# Patient Record
Sex: Male | Born: 1940 | Race: White | Hispanic: No | Marital: Married | Smoking: Former smoker
Health system: Southern US, Community
[De-identification: ages and names within clinical notes are randomized; demographics above are authoritative.]

## PROBLEM LIST (undated history)

## (undated) DIAGNOSIS — K219 Gastro-esophageal reflux disease without esophagitis: Secondary | ICD-10-CM

## (undated) DIAGNOSIS — D126 Benign neoplasm of colon, unspecified: Secondary | ICD-10-CM

## (undated) DIAGNOSIS — I10 Essential (primary) hypertension: Secondary | ICD-10-CM

## (undated) DIAGNOSIS — C61 Malignant neoplasm of prostate: Secondary | ICD-10-CM

## (undated) DIAGNOSIS — T7840XA Allergy, unspecified, initial encounter: Secondary | ICD-10-CM

## (undated) DIAGNOSIS — K5790 Diverticulosis of intestine, part unspecified, without perforation or abscess without bleeding: Secondary | ICD-10-CM

## (undated) DIAGNOSIS — C4490 Unspecified malignant neoplasm of skin, unspecified: Secondary | ICD-10-CM

## (undated) DIAGNOSIS — I499 Cardiac arrhythmia, unspecified: Secondary | ICD-10-CM

## (undated) DIAGNOSIS — G4733 Obstructive sleep apnea (adult) (pediatric): Secondary | ICD-10-CM

## (undated) DIAGNOSIS — E785 Hyperlipidemia, unspecified: Secondary | ICD-10-CM

## (undated) DIAGNOSIS — I509 Heart failure, unspecified: Secondary | ICD-10-CM

## (undated) HISTORY — DX: Essential (primary) hypertension: I10

## (undated) HISTORY — DX: Diverticulosis of intestine, part unspecified, without perforation or abscess without bleeding: K57.90

## (undated) HISTORY — DX: Hyperlipidemia, unspecified: E78.5

## (undated) HISTORY — DX: Allergy, unspecified, initial encounter: T78.40XA

## (undated) HISTORY — DX: Malignant neoplasm of prostate: C61

## (undated) HISTORY — DX: Gastro-esophageal reflux disease without esophagitis: K21.9

## (undated) HISTORY — DX: Benign neoplasm of colon, unspecified: D12.6

## (undated) HISTORY — DX: Heart failure, unspecified: I50.9

## (undated) HISTORY — DX: Obstructive sleep apnea (adult) (pediatric): G47.33

## (undated) HISTORY — DX: Unspecified malignant neoplasm of skin, unspecified: C44.90

---

## 1997-08-10 ENCOUNTER — Ambulatory Visit (HOSPITAL_COMMUNITY): Admission: RE | Admit: 1997-08-10 | Discharge: 1997-08-10 | Payer: Self-pay | Admitting: Internal Medicine

## 1997-12-05 ENCOUNTER — Other Ambulatory Visit: Admission: RE | Admit: 1997-12-05 | Discharge: 1997-12-05 | Payer: Self-pay | Admitting: *Deleted

## 1997-12-06 HISTORY — PX: NASAL SEPTUM SURGERY: SHX37

## 1998-06-08 HISTORY — PX: TRANSURETHRAL NEEDLE ABLATION OF THE PROSTATE: SHX2574

## 1998-12-05 ENCOUNTER — Other Ambulatory Visit: Admission: RE | Admit: 1998-12-05 | Discharge: 1998-12-05 | Payer: Self-pay | Admitting: Urology

## 1999-01-01 ENCOUNTER — Encounter: Admission: RE | Admit: 1999-01-01 | Discharge: 1999-04-01 | Payer: Self-pay | Admitting: Family Medicine

## 1999-01-07 HISTORY — PX: PROSTATE SURGERY: SHX751

## 1999-01-30 ENCOUNTER — Ambulatory Visit (HOSPITAL_COMMUNITY): Admission: RE | Admit: 1999-01-30 | Discharge: 1999-01-30 | Payer: Self-pay | Admitting: Urology

## 1999-04-07 ENCOUNTER — Encounter: Admission: RE | Admit: 1999-04-07 | Discharge: 1999-07-06 | Payer: Self-pay | Admitting: Radiation Oncology

## 1999-06-09 HISTORY — PX: INSERTION PROSTATE RADIATION SEED: SUR718

## 1999-06-19 ENCOUNTER — Ambulatory Visit (HOSPITAL_BASED_OUTPATIENT_CLINIC_OR_DEPARTMENT_OTHER): Admission: RE | Admit: 1999-06-19 | Discharge: 1999-06-19 | Payer: Self-pay | Admitting: Urology

## 1999-06-19 ENCOUNTER — Encounter: Payer: Self-pay | Admitting: Urology

## 1999-07-10 ENCOUNTER — Ambulatory Visit (HOSPITAL_COMMUNITY): Admission: RE | Admit: 1999-07-10 | Discharge: 1999-07-10 | Payer: Self-pay | Admitting: Family Medicine

## 1999-07-17 ENCOUNTER — Encounter: Admission: RE | Admit: 1999-07-17 | Discharge: 1999-10-15 | Payer: Self-pay | Admitting: Radiation Oncology

## 2000-10-04 ENCOUNTER — Ambulatory Visit (HOSPITAL_COMMUNITY): Admission: RE | Admit: 2000-10-04 | Discharge: 2000-10-04 | Payer: Self-pay | Admitting: Internal Medicine

## 2000-10-04 ENCOUNTER — Encounter: Payer: Self-pay | Admitting: Internal Medicine

## 2000-12-15 ENCOUNTER — Other Ambulatory Visit: Admission: RE | Admit: 2000-12-15 | Discharge: 2000-12-15 | Payer: Self-pay | Admitting: Gastroenterology

## 2000-12-15 ENCOUNTER — Encounter (INDEPENDENT_AMBULATORY_CARE_PROVIDER_SITE_OTHER): Payer: Self-pay | Admitting: Specialist

## 2001-04-13 ENCOUNTER — Encounter: Admission: RE | Admit: 2001-04-13 | Discharge: 2001-04-13 | Payer: Self-pay | Admitting: Internal Medicine

## 2001-04-13 ENCOUNTER — Encounter: Payer: Self-pay | Admitting: Internal Medicine

## 2001-12-01 ENCOUNTER — Ambulatory Visit (HOSPITAL_COMMUNITY): Admission: RE | Admit: 2001-12-01 | Discharge: 2001-12-01 | Payer: Self-pay | Admitting: Internal Medicine

## 2001-12-01 ENCOUNTER — Encounter: Payer: Self-pay | Admitting: Internal Medicine

## 2003-03-19 ENCOUNTER — Ambulatory Visit (HOSPITAL_COMMUNITY): Admission: RE | Admit: 2003-03-19 | Discharge: 2003-03-19 | Payer: Self-pay | Admitting: Internal Medicine

## 2003-03-19 ENCOUNTER — Encounter: Payer: Self-pay | Admitting: Internal Medicine

## 2004-02-26 ENCOUNTER — Ambulatory Visit (HOSPITAL_COMMUNITY): Admission: RE | Admit: 2004-02-26 | Discharge: 2004-02-26 | Payer: Self-pay | Admitting: Internal Medicine

## 2005-03-09 ENCOUNTER — Encounter: Admission: RE | Admit: 2005-03-09 | Discharge: 2005-03-09 | Payer: Self-pay | Admitting: Internal Medicine

## 2005-08-24 ENCOUNTER — Ambulatory Visit (HOSPITAL_COMMUNITY): Admission: RE | Admit: 2005-08-24 | Discharge: 2005-08-24 | Payer: Self-pay | Admitting: Internal Medicine

## 2009-02-14 ENCOUNTER — Encounter (INDEPENDENT_AMBULATORY_CARE_PROVIDER_SITE_OTHER): Payer: Self-pay | Admitting: *Deleted

## 2009-05-16 ENCOUNTER — Encounter: Admission: RE | Admit: 2009-05-16 | Discharge: 2009-05-16 | Payer: Self-pay | Admitting: Family Medicine

## 2010-10-24 NOTE — Op Note (Signed)
[place]  Patient:    Vincent [name]                           MRN: [id number] Proc. Date: 06/19/99 Adm. Date:  [id number] Attending:  Laqueta Jean CC:         Marinus Maw, M.D.                           Operative Report  PREOPERATIVE DIAGNOSIS:  Adenocarcinoma of the prostate.  POSTOPERATIVE DIAGNOSIS:  Adenocarcinoma of the prostate.  OPERATION:  Radioactive iodine seed prostate implant.  SURGEON:  Sigmund I. Patsi Sears, M.D.  ANESTHESIA:  General endotracheal.  PREPARATION:  After appropriate preanesthesia the patient was brought to the operating room and placed on the operating table in the dorsal supine position,  where [name] general endotracheal anesthesia was introduced.  He was replaced in the  dorsal lithotomy position.  The pubis was prepped with Betadine solution and draped in the usual fashion.  The radiation therapist is Dr. Maryln Gottron.  INDICATIONS:  This 70 year old married white male has [name] history of Gleason 3+4 adenocarcinoma in the right central biopsy, Gleason 5 adenocarcinoma in the left central biopsies, and Gleason 6 adenocarcinoma in the left lateral biopsies. Because of the patients elevated International Prostate symptoms, score sheet of 12, and failure of Flomax therapy, he underwent [name] transurethral needle ablation (2) precancer therapy.  Following this, the patient voided well, and underwent external beam radiation therapy.  He then went to [place] on [name] trip, and with full continence. He returned for seed implantation which is accomplished this morning.  DESCRIPTION OF PROCEDURE:  The patient undergoes an iodine seed implantation under routine circumstances.  [name] cystoscopy revealed one strand of seeds in the bladder at the right bladder neck, and these were removed cystoscopically.  The rectal wall was protected.  The urethra was protected.  [name] Foley catheter was left in place.  The patient  was awakened and taken to the recovery room in excellent condition. DD:  06/19/99 TD:  06/19/99 Job: [id number]

## 2011-02-13 ENCOUNTER — Encounter: Payer: Self-pay | Admitting: Gastroenterology

## 2011-03-09 ENCOUNTER — Encounter: Payer: Self-pay | Admitting: Gastroenterology

## 2011-03-09 ENCOUNTER — Other Ambulatory Visit: Payer: Self-pay | Admitting: Dermatology

## 2011-04-09 ENCOUNTER — Ambulatory Visit (AMBULATORY_SURGERY_CENTER): Payer: Medicare Other | Admitting: *Deleted

## 2011-04-09 VITALS — Ht 76.0 in | Wt 190.0 lb

## 2011-04-09 DIAGNOSIS — Z1211 Encounter for screening for malignant neoplasm of colon: Secondary | ICD-10-CM

## 2011-04-09 MED ORDER — PEG-KCL-NACL-NASULF-NA ASC-C 100 G PO SOLR: ORAL | Status: DC

## 2011-04-21 ENCOUNTER — Encounter: Payer: Self-pay | Admitting: Gastroenterology

## 2011-04-21 ENCOUNTER — Ambulatory Visit (AMBULATORY_SURGERY_CENTER): Payer: Medicare Other | Admitting: Gastroenterology

## 2011-04-21 DIAGNOSIS — Z86010 Personal history of colonic polyps: Secondary | ICD-10-CM

## 2011-04-21 DIAGNOSIS — K5730 Diverticulosis of large intestine without perforation or abscess without bleeding: Secondary | ICD-10-CM

## 2011-04-21 DIAGNOSIS — D126 Benign neoplasm of colon, unspecified: Secondary | ICD-10-CM

## 2011-04-21 DIAGNOSIS — Z1211 Encounter for screening for malignant neoplasm of colon: Secondary | ICD-10-CM

## 2011-04-21 MED ORDER — SODIUM CHLORIDE 0.9 % IV SOLN: 500.0000 mL | INTRAVENOUS | Status: DC

## 2011-04-21 NOTE — Patient Instructions (Addendum)
Diverticulosis Diverticulosis is a common condition that develops when small pouches (diverticula) form in the wall of the colon. The risk of diverticulosis increases with age. It happens more often in people who eat a low-fiber diet. Most individuals with diverticulosis have no symptoms. Those individuals with symptoms usually experience abdominal pain, constipation, or loose stools (diarrhea). HOME CARE INSTRUCTIONS   Increase the amount of fiber in your diet as directed by your caregiver or dietician. This may reduce symptoms of diverticulosis.   Your caregiver may recommend taking a dietary fiber supplement.   Drink at least 6 to 8 glasses of water each day to prevent constipation.   Try not to strain when you have a bowel movement.   Your caregiver may recommend avoiding nuts and seeds to prevent complications, although this is still an uncertain benefit.   Only take over-the-counter or prescription medicines for pain, discomfort, or fever as directed by your caregiver.  FOODS WITH HIGH FIBER CONTENT INCLUDE:  Fruits. Apple, peach, pear, tangerine, raisins, prunes.   Vegetables. Brussels sprouts, asparagus, broccoli, cabbage, carrot, cauliflower, romaine lettuce, spinach, summer squash, tomato, winter squash, zucchini.   Starchy Vegetables. Baked beans, kidney beans, lima beans, split peas, lentils, potatoes (with skin).   Grains. Whole wheat bread, brown rice, bran flake cereal, plain oatmeal, white rice, shredded wheat, bran muffins.  SEEK IMMEDIATE MEDICAL CARE IF:   You develop increasing pain or severe bloating.   You have an oral temperature above 102 F (38.9 C), not controlled by medicine.   You develop vomiting or bowel movements that are bloody or black.  Document Released: 02/20/2004 Document Revised: 02/04/2011 Document Reviewed: 10/23/2009 [facility] Patient Information 2012 [place], [place].  Please follow all discharge instructions given to you by the recovery room  nurse. If you have any questions or problems after discharge please call [phone]. You will receive a phone call in the am to see how you are doing and answer any questions you may have. Thank you for choosing [facility] for your health care needs.

## 2011-04-21 NOTE — Progress Notes (Signed)
Pt tolerated the colonoscopy very well. maw 

## 2011-04-21 NOTE — Progress Notes (Signed)
Patient did not experience any of the following events: a burn prior to discharge; a fall within the facility; wrong site/side/patient/procedure/implant event; or a hospital transfer or hospital admission upon discharge from the facility. SSI prophylaxis.   Patient did not have preoperative order for IV antibiotic SSI prophylaxis. [phone])

## 2011-04-22 ENCOUNTER — Telehealth: Payer: Self-pay | Admitting: *Deleted

## 2011-04-22 NOTE — Telephone Encounter (Signed)
Follow up Call- Patient questions:  Do you have a fever, pain , or abdominal swelling? no Pain Score  0 *  Have you tolerated food without any problems? yes  Have you been able to return to your normal activities? yes  Do you have any questions about your discharge instructions: Diet   no Medications  no Follow up visit  no  Do you have questions or concerns about your Care? yes  Actions: * If pain score is 4 or above: No action needed, pain <4.   

## 2011-05-05 ENCOUNTER — Telehealth: Payer: Self-pay | Admitting: Gastroenterology

## 2011-05-05 NOTE — Telephone Encounter (Signed)
Sorry, I mixed cellular. Tone that he has a benign polyp and a letter with recommendations is  forthcoming

## 2011-05-05 NOTE — Telephone Encounter (Signed)
Pt calling requesting results from path report from colon done 04/21/11.........Marland Kitchenplease advise.

## 2011-05-05 NOTE — Telephone Encounter (Signed)
Discussed with pt his path report and the result letter. Pt aware.

## 2011-05-22 ENCOUNTER — Other Ambulatory Visit: Payer: Self-pay | Admitting: Family Medicine

## 2011-05-22 ENCOUNTER — Ambulatory Visit
Admission: RE | Admit: 2011-05-22 | Discharge: 2011-05-22 | Disposition: A | Discharge status: Home / Self Care | Payer: Medicare Other | Source: Ambulatory Visit | Attending: Family Medicine | Admitting: Family Medicine

## 2011-05-22 DIAGNOSIS — R7611 Nonspecific reaction to tuberculin skin test without active tuberculosis: Secondary | ICD-10-CM

## 2011-10-20 ENCOUNTER — Other Ambulatory Visit: Payer: Self-pay | Admitting: Dermatology

## 2012-05-11 IMAGING — CR DG CHEST 2V
2 series · 2 of 2 positions shown · non-contrast
Comparison: Chest radiographs 05/16/2009.

CLINICAL DATA: Positive PPD.  History of smoking and hypertension.

CHEST - 2 VIEW

[w chest pa]
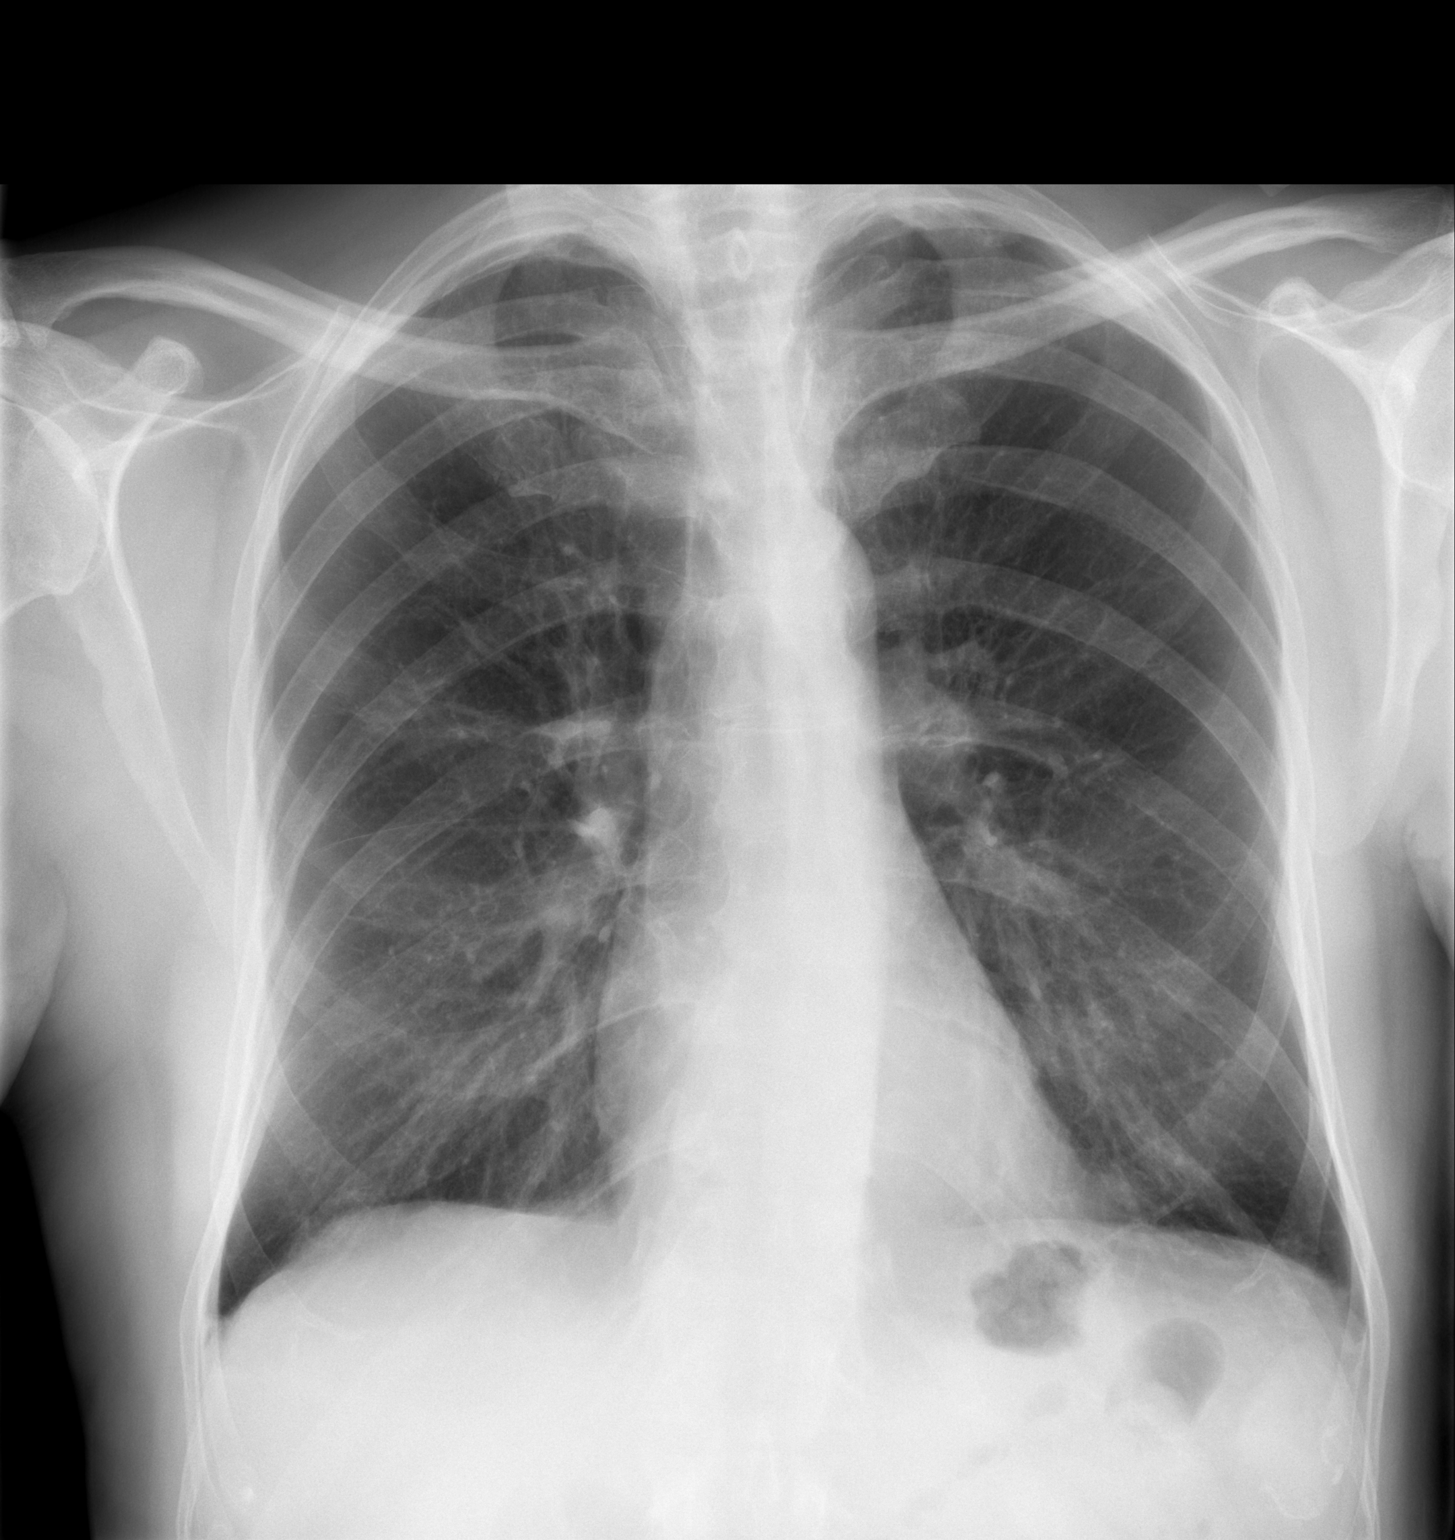

[w chest lat]
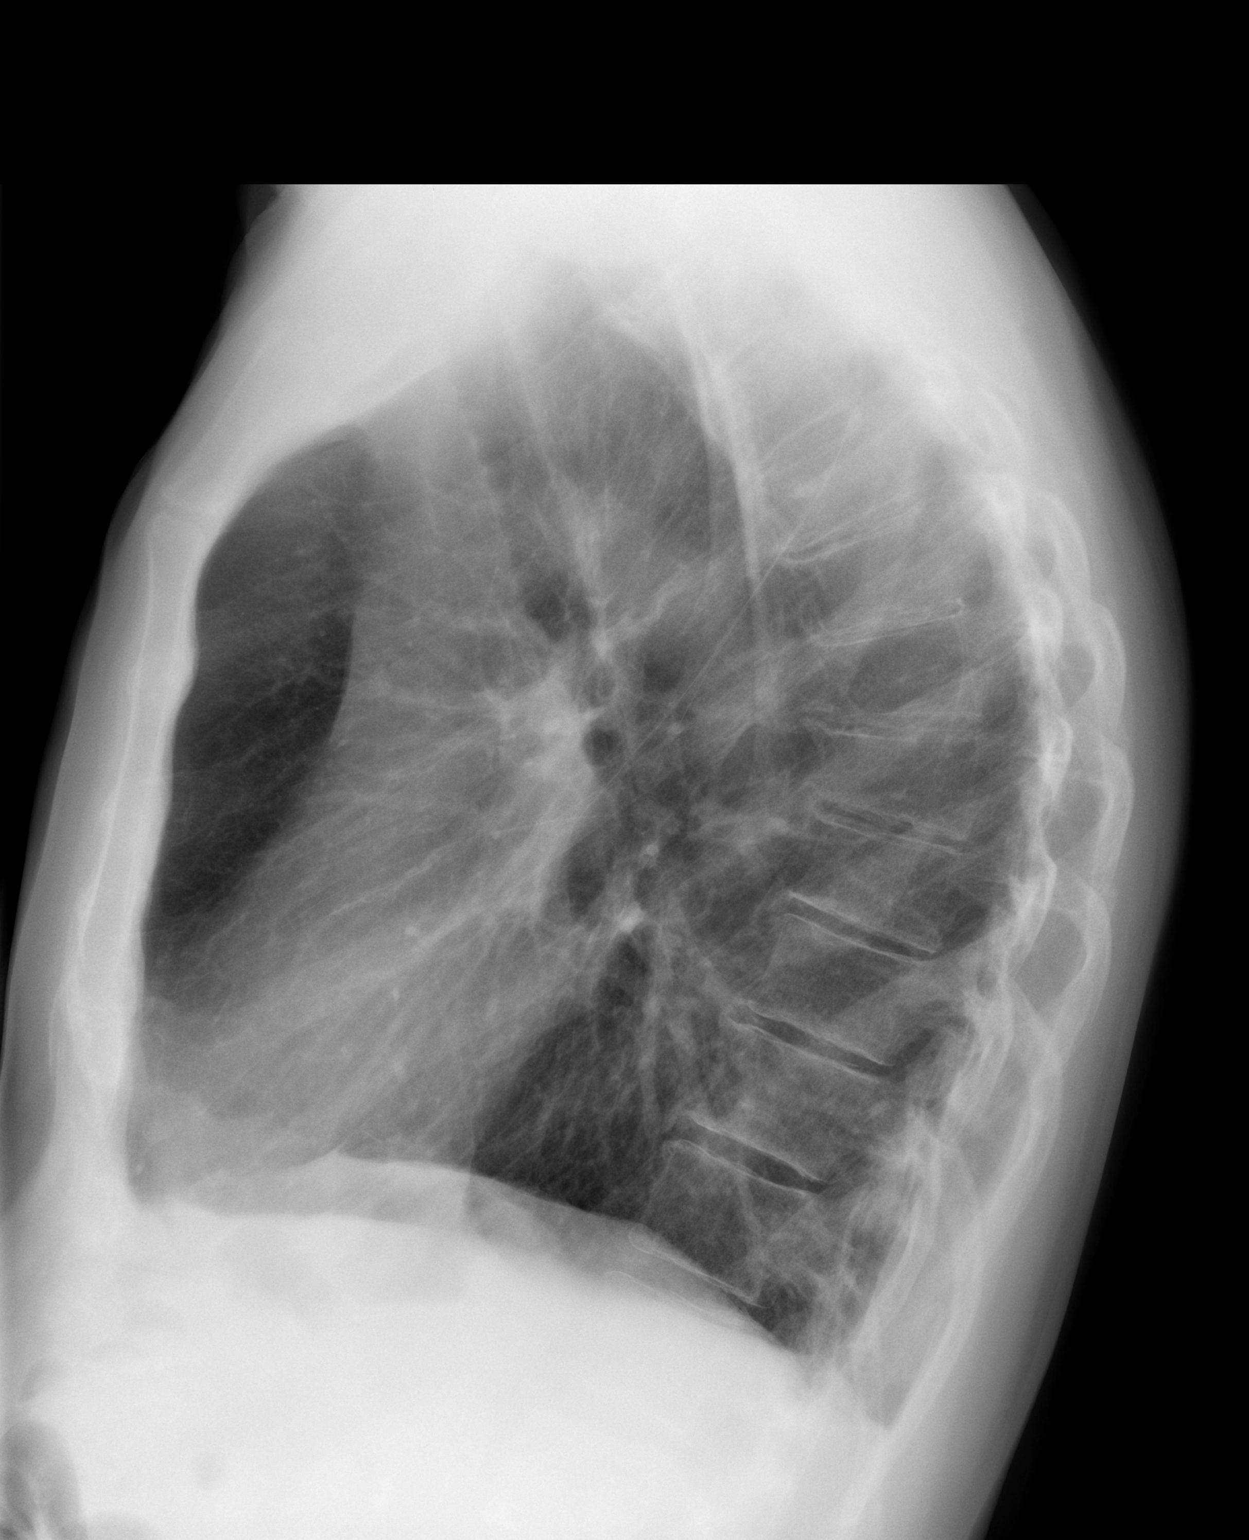

[2 of 2 positions shown; findings below may reference images not displayed]

FINDINGS: The heart size and mediastinal contours are stable.
There is stable mild scarring in the right perihilar region.  Mild
biapical pleural thickening is stable.  There is no confluent
airspace opacity or pleural effusion.  Osseous structures appear
unchanged.
IMPRESSION: Stable examination.  No acute findings or radiographic evidence of
tuberculosis.

## 2012-08-04 ENCOUNTER — Emergency Department (HOSPITAL_COMMUNITY)
Admission: EM | Admit: 2012-08-04 | Discharge: 2012-08-05 | Disposition: A | Discharge status: Home / Self Care | Payer: Medicare Other | Attending: Emergency Medicine | Admitting: Emergency Medicine

## 2012-08-04 ENCOUNTER — Encounter (HOSPITAL_COMMUNITY): Payer: Self-pay | Admitting: *Deleted

## 2012-08-04 DIAGNOSIS — Z923 Personal history of irradiation: Secondary | ICD-10-CM | POA: Insufficient documentation

## 2012-08-04 DIAGNOSIS — K219 Gastro-esophageal reflux disease without esophagitis: Secondary | ICD-10-CM | POA: Insufficient documentation

## 2012-08-04 DIAGNOSIS — Z79899 Other long term (current) drug therapy: Secondary | ICD-10-CM | POA: Insufficient documentation

## 2012-08-04 DIAGNOSIS — Z8546 Personal history of malignant neoplasm of prostate: Secondary | ICD-10-CM | POA: Insufficient documentation

## 2012-08-04 DIAGNOSIS — Z85828 Personal history of other malignant neoplasm of skin: Secondary | ICD-10-CM | POA: Insufficient documentation

## 2012-08-04 DIAGNOSIS — Z7982 Long term (current) use of aspirin: Secondary | ICD-10-CM | POA: Insufficient documentation

## 2012-08-04 DIAGNOSIS — S61209A Unspecified open wound of unspecified finger without damage to nail, initial encounter: Secondary | ICD-10-CM | POA: Insufficient documentation

## 2012-08-04 DIAGNOSIS — Y929 Unspecified place or not applicable: Secondary | ICD-10-CM | POA: Insufficient documentation

## 2012-08-04 DIAGNOSIS — I10 Essential (primary) hypertension: Secondary | ICD-10-CM | POA: Insufficient documentation

## 2012-08-04 DIAGNOSIS — W268XXA Contact with other sharp object(s), not elsewhere classified, initial encounter: Secondary | ICD-10-CM | POA: Insufficient documentation

## 2012-08-04 DIAGNOSIS — Z87891 Personal history of nicotine dependence: Secondary | ICD-10-CM | POA: Insufficient documentation

## 2012-08-04 DIAGNOSIS — Y93G1 Activity, food preparation and clean up: Secondary | ICD-10-CM | POA: Insufficient documentation

## 2012-08-04 NOTE — ED Notes (Signed)
Pt unloading dishwasher; pt cut middle finger right hand; small laceration noted with bleeding thru initial bandage; pressure dressing applied; does not take blood thinners

## 2012-08-04 NOTE — ED Notes (Signed)
PA student at bedside suturing pt's finger.

## 2012-08-04 NOTE — ED Provider Notes (Signed)
History  This chart was scribed for non-physician practitioner working with No att. providers found, by Candelaria Stagers, ED Scribe. This patient was seen in room WTR5/WTR5 and the patient's care was started at 10:57 PM   CSN: [id number]  Arrival date & time 08/04/12  2202   First MD Initiated Contact with Patient 08/04/12 2234      Chief Complaint  Patient presents with  . Laceration    The history is provided by the patient. No language interpreter was used.   Vincent [name] is a 72 y.o. male who presents to the Emergency Department complaining of a laceration to his right middle finger after unloading the dishwasher and cutting his finger on a broken ceramic mug.  Pt reports his pain currently as a 2/10.  Pt took hydrocodone before the incident for a previous shoulder problem.  Pt does not take blood thinners.  Last tetanus shot 2012. Denies lightheadedness, dizziness, weakness, numbness or tingling.  Past Medical History  Diagnosis Date  . Allergy     seasonal  . Cancer     basal cell/squamouscell  . GERD (gastroesophageal reflux disease)   . Hyperlipidemia   . Hypertension   . Prostate cancer Nov.-Dec.2000    radiation    Past Surgical History  Procedure Laterality Date  . Prostate surgery  01/1999  . Insertion prostate radiation seed  06/1999  . Nasal septum surgery  12/1997    also polyps removed    No family history on file.  History  Substance Use Topics  . Smoking status: Former Games developer  . Smokeless tobacco: Never Used  . Alcohol Use: 6.0 oz/week    10 Glasses of wine per week      Review of Systems  Skin: Positive for wound (laceration to right middle finger).  All other systems reviewed and are negative.    Allergies  Review of patient's allergies indicates no known allergies.  Home Medications   Current Outpatient Rx  Name  Route  Sig  Dispense  Refill  . aspirin 81 MG tablet   Oral   Take 81 mg by mouth daily.           . bethanechol  (URECHOLINE) 25 MG tablet   Oral   Take 75 mg by mouth daily.         . bisoprolol-hydrochlorothiazide (ZIAC) 5-6.25 MG per tablet   Oral   Take 1 tablet by mouth daily.         . calcium carbonate (OS-CAL) 600 MG TABS   Oral   Take 1,200 mg by mouth daily.           . Cholecalciferol (VITAMIN D) 2000 UNITS tablet   Oral   Take 6,000 Units by mouth daily.           Marland Kitchen doxazosin (CARDURA) 4 MG tablet   Oral   Take 4 mg by mouth at bedtime.           Marland Kitchen HYDROcodone-acetaminophen (NORCO/VICODIN) 5-325 MG per tablet   Oral   Take 1-2 tablets by mouth every 6 (six) hours as needed for pain. Pain         . Misc Natural Products (PUMPKIN SEED OIL) CAPS   Oral   Take 1 capsule by mouth daily.           . mometasone (NASONEX) 50 MCG/ACT nasal spray   Nasal   Place 1 spray into the nose daily.           Marland Kitchen  Multiple Vitamins-Minerals (MULTIVITAMIN WITH MINERALS) tablet   Oral   Take 1 tablet by mouth daily.           Marland Kitchen omeprazole (PRILOSEC) 20 MG capsule   Oral   Take 1 tablet by mouth Daily.         . pravastatin (PRAVACHOL) 40 MG tablet   Oral   Take 20 mg by mouth Daily.         . psyllium (REGULOID) 0.52 G capsule   Oral   Take 0.52 g by mouth daily.           . vitamin C (ASCORBIC ACID) 500 MG tablet   Oral   Take 500 mg by mouth daily.           . vitamin E 400 UNIT capsule   Oral   Take 400 Units by mouth daily.             BP 167/74  Pulse 85  Temp(Src) 98.6 F (37 C) (Oral)  Resp 16  Ht 6\' 4"  (1.93 m)  Wt 192 lb (87.091 kg)  BMI 23.38 kg/m2  SpO2 96%  Physical Exam  Nursing note and vitals reviewed. Constitutional: He is oriented to person, place, and time. He appears well-developed and well-nourished. No distress.  HENT:  Head: Normocephalic and atraumatic.  Mouth/Throat: Oropharynx is clear and moist.  Eyes: Conjunctivae and EOM are normal.  Neck: Normal range of motion. Neck supple. No tracheal deviation present.   Cardiovascular: Normal rate, regular rhythm, normal heart sounds and intact distal pulses.   Pulmonary/Chest: Effort normal and breath sounds normal. No respiratory distress.  Musculoskeletal: Normal range of motion.  1.5 cm laceration on the palmar aspect or right middle PIP.  No evidence of tendon disruption.  Good cap refill.  Full ROM.  Actively bleeding.    Neurological: He is alert and oriented to person, place, and time.  Skin: Skin is warm and dry. No pallor.  Psychiatric: He has a normal mood and affect. His behavior is normal.    ED Course  Procedures   DIAGNOSTIC STUDIES: Oxygen Saturation is 96% on room air, normal by my interpretation.    COORDINATION OF CARE:  10:57 PM Discussed course of care with pt which includes laceration repair.  Pt understands and agrees.  11:06 PM LACERATION REPAIR Performed by: Johnnette Gourd, PA-C Consent: Verbal consent obtained. Risks and benefits: risks, benefits and alternatives were discussed Patient identity confirmed: provided demographic data Time out performed prior to procedure Prepped and Draped in normal sterile fashion Wound explored Laceration Location: palmar side of right middle finger Laceration Length: 1.5cm No Foreign Bodies seen or palpated Anesthesia: local infiltration Local anesthetic: lidocaine 2% without epinephrine Anesthetic total: 1.5 ml Irrigation method: syringe Amount of cleaning: standard Skin closure: 5-0 ethilon Number of sutures or staples: 5 Technique: simple interrupted Patient tolerance: Patient tolerated the procedure well with no immediate complications.  Labs Reviewed - No data to display No results found.   1. Finger laceration, initial encounter       MDM  72 y/o male with finger laceration. No evidence of tendinous disruption. Wound cleaned and explored without any foreign body. 5 sutures placed without difficulty. Patient is still neurovascularly intact without evidence of tendon  disruption after suture placement. Return precautions discussed. Wound care given. He will followup with his PCP for suture removal. Patient states understanding of plan and is agreeable.    I personally performed the services described in this documentation,  which was scribed in my presence. The recorded information has been reviewed and is accurate.      Trevor Mace, PA-C 08/05/12 0006

## 2012-08-05 NOTE — ED Provider Notes (Signed)
Medical screening examination/treatment/procedure(s) were performed by non-physician practitioner and as supervising physician I was immediately available for consultation/collaboration.   Lyanne Co, MD 08/05/12 [phone]

## 2012-08-24 ENCOUNTER — Other Ambulatory Visit: Payer: Self-pay | Admitting: Family Medicine

## 2012-08-24 DIAGNOSIS — M5412 Radiculopathy, cervical region: Secondary | ICD-10-CM

## 2012-08-24 DIAGNOSIS — M79601 Pain in right arm: Secondary | ICD-10-CM

## 2012-08-24 DIAGNOSIS — M542 Cervicalgia: Secondary | ICD-10-CM

## 2012-08-25 ENCOUNTER — Ambulatory Visit
Admission: RE | Admit: 2012-08-25 | Discharge: 2012-08-25 | Disposition: A | Discharge status: Home / Self Care | Payer: Self-pay | Source: Ambulatory Visit | Attending: Family Medicine | Admitting: Family Medicine

## 2012-08-25 DIAGNOSIS — M5412 Radiculopathy, cervical region: Secondary | ICD-10-CM

## 2012-08-25 DIAGNOSIS — M542 Cervicalgia: Secondary | ICD-10-CM

## 2012-08-25 DIAGNOSIS — M79601 Pain in right arm: Secondary | ICD-10-CM

## 2013-01-03 ENCOUNTER — Other Ambulatory Visit: Payer: Self-pay | Admitting: Dermatology

## 2013-09-26 ENCOUNTER — Other Ambulatory Visit: Payer: Self-pay | Admitting: Dermatology

## 2014-03-16 ENCOUNTER — Encounter (HOSPITAL_COMMUNITY): Payer: Self-pay

## 2014-03-16 ENCOUNTER — Encounter (HOSPITAL_COMMUNITY)
Admission: AD | Disposition: A | Discharge status: Home / Self Care | Payer: 59 | Source: Ambulatory Visit | Attending: Cardiology

## 2014-03-16 ENCOUNTER — Observation Stay (HOSPITAL_COMMUNITY)
Admission: AD | Admit: 2014-03-16 | Discharge: 2014-03-17 | Disposition: A | Discharge status: Home / Self Care | Payer: 59 | Source: Ambulatory Visit | Attending: Cardiology | Admitting: Cardiology

## 2014-03-16 DIAGNOSIS — Z923 Personal history of irradiation: Secondary | ICD-10-CM | POA: Insufficient documentation

## 2014-03-16 DIAGNOSIS — I10 Essential (primary) hypertension: Secondary | ICD-10-CM | POA: Diagnosis not present

## 2014-03-16 DIAGNOSIS — I209 Angina pectoris, unspecified: Secondary | ICD-10-CM | POA: Diagnosis present

## 2014-03-16 DIAGNOSIS — E785 Hyperlipidemia, unspecified: Secondary | ICD-10-CM | POA: Insufficient documentation

## 2014-03-16 DIAGNOSIS — K219 Gastro-esophageal reflux disease without esophagitis: Secondary | ICD-10-CM | POA: Diagnosis not present

## 2014-03-16 DIAGNOSIS — Z8546 Personal history of malignant neoplasm of prostate: Secondary | ICD-10-CM | POA: Diagnosis not present

## 2014-03-16 DIAGNOSIS — I25110 Atherosclerotic heart disease of native coronary artery with unstable angina pectoris: Principal | ICD-10-CM | POA: Insufficient documentation

## 2014-03-16 HISTORY — DX: Cardiac arrhythmia, unspecified: I49.9

## 2014-03-16 HISTORY — PX: LEFT HEART CATHETERIZATION WITH CORONARY ANGIOGRAM: SHX5451

## 2014-03-16 LAB — COMPREHENSIVE METABOLIC PANEL
ALBUMIN: 3.4 g/dL — AB (ref 3.5–5.2)
ALK PHOS: 66 U/L (ref 39–117)
ALT: 15 U/L (ref 0–53)
AST: 18 U/L (ref 0–37)
Anion gap: 10 (ref 5–15)
BUN: 18 mg/dL (ref 6–23)
CHLORIDE: 105 meq/L (ref 96–112)
CO2: 27 mEq/L (ref 19–32)
Calcium: 9.1 mg/dL (ref 8.4–10.5)
Creatinine, Ser: 0.95 mg/dL (ref 0.50–1.35)
GFR calc Af Amer: >90 mL/min (ref >90)
GFR calc non Af Amer: 79 mL/min — ABNORMAL LOW (ref >90)
Glucose, Bld: 111 mg/dL — ABNORMAL HIGH (ref 70–99)
POTASSIUM: 4 meq/L (ref 3.7–5.3)
Sodium: 142 mEq/L (ref 137–147)
Total Bilirubin: 0.4 mg/dL (ref 0.3–1.2)
Total Protein: 6.4 g/dL (ref 6.0–8.3)

## 2014-03-16 LAB — CBC WITH DIFFERENTIAL/PLATELET
BASOS ABS: 0 10*3/uL (ref 0.0–0.1)
Basophils Relative: 1 % (ref 0–1)
Eosinophils Absolute: 0.2 10*3/uL (ref 0.0–0.7)
Eosinophils Relative: 3 % (ref 0–5)
HEMATOCRIT: 41 % (ref 39.0–52.0)
HEMOGLOBIN: 13.5 g/dL (ref 13.0–17.0)
LYMPHS PCT: 15 % (ref 12–46)
Lymphs Abs: 1.1 10*3/uL (ref 0.7–4.0)
MCH: 28.1 pg (ref 26.0–34.0)
MCHC: 32.9 g/dL (ref 30.0–36.0)
MCV: 85.4 fL (ref 78.0–100.0)
MONO ABS: 0.6 10*3/uL (ref 0.1–1.0)
MONOS PCT: 8 % (ref 3–12)
NEUTROS ABS: 5.5 10*3/uL (ref 1.7–7.7)
Neutrophils Relative %: 73 % (ref 43–77)
Platelets: 122 10*3/uL — ABNORMAL LOW (ref 150–400)
RBC: 4.8 MIL/uL (ref 4.22–5.81)
RDW: 13.5 % (ref 11.5–15.5)
WBC: 7.5 10*3/uL (ref 4.0–10.5)

## 2014-03-16 LAB — PROTIME-INR
INR: 1.05 (ref 0.00–1.49)
Prothrombin Time: 13.7 seconds (ref 11.6–15.2)

## 2014-03-16 LAB — POCT ACTIVATED CLOTTING TIME
ACTIVATED CLOTTING TIME: 484 s
Activated Clotting Time: 225 seconds

## 2014-03-16 LAB — APTT: APTT: 31 s (ref 24–37)

## 2014-03-16 LAB — TSH: TSH: 1.14 u[IU]/mL (ref 0.350–4.500)

## 2014-03-16 SURGERY — LEFT HEART CATHETERIZATION WITH CORONARY ANGIOGRAM
Anesthesia: LOCAL

## 2014-03-16 MED ORDER — TICAGRELOR 90 MG PO TABS: 90.0000 mg | ORAL_TABLET | Freq: Two times a day (BID) | ORAL | Status: DC

## 2014-03-16 MED ORDER — SODIUM CHLORIDE 0.9 % IV SOLN: INTRAVENOUS | Status: DC

## 2014-03-16 MED ORDER — ACETAMINOPHEN 325 MG PO TABS
650.0000 mg | ORAL_TABLET | ORAL | Status: DC | PRN
  Administered 2014-03-16 – 2014-03-17 (×2): 650 mg via ORAL
  Filled 2014-03-16 (×2): qty 2

## 2014-03-16 MED ORDER — BETHANECHOL CHLORIDE 25 MG PO TABS
25.0000 mg | ORAL_TABLET | Freq: Two times a day (BID) | ORAL | Status: DC
  Administered 2014-03-16 – 2014-03-17 (×2): 25 mg via ORAL
  Filled 2014-03-16 (×5): qty 1

## 2014-03-16 MED ORDER — INFLUENZA VAC SPLIT QUAD 0.5 ML IM SUSY
0.5000 mL | PREFILLED_SYRINGE | INTRAMUSCULAR | Status: AC
  Administered 2014-03-17: 0.5 mL via INTRAMUSCULAR
  Filled 2014-03-16: qty 0.5

## 2014-03-16 MED ORDER — BIVALIRUDIN 250 MG IV SOLR
INTRAVENOUS | Status: AC
  Filled 2014-03-16: qty 250

## 2014-03-16 MED ORDER — TICAGRELOR 90 MG PO TABS
90.0000 mg | ORAL_TABLET | Freq: Two times a day (BID) | ORAL | Status: DC
  Administered 2014-03-17: 05:00:00 90 mg via ORAL
  Filled 2014-03-16 (×2): qty 1

## 2014-03-16 MED ORDER — PRAVASTATIN SODIUM 80 MG PO TABS
80.0000 mg | ORAL_TABLET | Freq: Every day | ORAL | Status: DC
  Administered 2014-03-16: 20:00:00 80 mg via ORAL
  Filled 2014-03-16 (×2): qty 1

## 2014-03-16 MED ORDER — PRAVASTATIN SODIUM 20 MG PO TABS
20.0000 mg | ORAL_TABLET | Freq: Every day | ORAL | Status: DC
  Filled 2014-03-16: qty 1

## 2014-03-16 MED ORDER — DOXAZOSIN MESYLATE 4 MG PO TABS
4.0000 mg | ORAL_TABLET | Freq: Every day | ORAL | Status: DC
  Administered 2014-03-16: 4 mg via ORAL
  Filled 2014-03-16 (×5): qty 1

## 2014-03-16 MED ORDER — ASPIRIN 300 MG RE SUPP
300.0000 mg | RECTAL | Status: AC
  Filled 2014-03-16: qty 1

## 2014-03-16 MED ORDER — HYDROMORPHONE HCL 1 MG/ML IJ SOLN
INTRAMUSCULAR | Status: AC
  Filled 2014-03-16: qty 1

## 2014-03-16 MED ORDER — ONDANSETRON HCL 4 MG/2ML IJ SOLN: 4.0000 mg | Freq: Four times a day (QID) | INTRAMUSCULAR | Status: DC | PRN

## 2014-03-16 MED ORDER — SODIUM CHLORIDE 0.9 % IJ SOLN: 3.0000 mL | Freq: Two times a day (BID) | INTRAMUSCULAR | Status: DC

## 2014-03-16 MED ORDER — HEPARIN SODIUM (PORCINE) 1000 UNIT/ML IJ SOLN
INTRAMUSCULAR | Status: AC
  Filled 2014-03-16: qty 1

## 2014-03-16 MED ORDER — SODIUM CHLORIDE 0.9 % IV SOLN: 250.0000 mL | INTRAVENOUS | Status: DC | PRN

## 2014-03-16 MED ORDER — NITROGLYCERIN 0.4 MG SL SUBL: 0.4000 mg | SUBLINGUAL_TABLET | SUBLINGUAL | Status: DC | PRN

## 2014-03-16 MED ORDER — PANTOPRAZOLE SODIUM 40 MG PO TBEC
40.0000 mg | DELAYED_RELEASE_TABLET | Freq: Every day | ORAL | Status: DC
  Administered 2014-03-17: 40 mg via ORAL
  Filled 2014-03-16: qty 1

## 2014-03-16 MED ORDER — VERAPAMIL HCL 2.5 MG/ML IV SOLN
INTRAVENOUS | Status: AC
  Filled 2014-03-16: qty 2

## 2014-03-16 MED ORDER — AMLODIPINE BESYLATE 5 MG PO TABS
5.0000 mg | ORAL_TABLET | Freq: Every day | ORAL | Status: DC
  Administered 2014-03-16: 5 mg via ORAL
  Filled 2014-03-16 (×2): qty 1

## 2014-03-16 MED ORDER — ISOSORBIDE MONONITRATE ER 60 MG PO TB24
60.0000 mg | ORAL_TABLET | Freq: Every day | ORAL | Status: DC
  Administered 2014-03-16: 20:00:00 60 mg via ORAL
  Filled 2014-03-16 (×2): qty 1

## 2014-03-16 MED ORDER — CLOPIDOGREL BISULFATE 300 MG PO TABS
ORAL_TABLET | ORAL | Status: AC
  Filled 2014-03-16: qty 2

## 2014-03-16 MED ORDER — HEPARIN (PORCINE) IN NACL 2-0.9 UNIT/ML-% IJ SOLN
INTRAMUSCULAR | Status: AC
  Filled 2014-03-16: qty 1500

## 2014-03-16 MED ORDER — SODIUM CHLORIDE 0.9 % IJ SOLN: 3.0000 mL | INTRAMUSCULAR | Status: DC | PRN

## 2014-03-16 MED ORDER — ASPIRIN EC 81 MG PO TBEC
81.0000 mg | DELAYED_RELEASE_TABLET | Freq: Every day | ORAL | Status: DC
  Administered 2014-03-17: 81 mg via ORAL
  Filled 2014-03-16: qty 1

## 2014-03-16 MED ORDER — HEPARIN SODIUM (PORCINE) 5000 UNIT/ML IJ SOLN
5000.0000 [IU] | Freq: Three times a day (TID) | INTRAMUSCULAR | Status: DC
  Administered 2014-03-17: 5000 [IU] via SUBCUTANEOUS
  Filled 2014-03-16 (×3): qty 1

## 2014-03-16 MED ORDER — BISOPROLOL-HYDROCHLOROTHIAZIDE 5-6.25 MG PO TABS
0.5000 | ORAL_TABLET | Freq: Every day | ORAL | Status: DC
  Administered 2014-03-17: 0.5 via ORAL
  Filled 2014-03-16 (×5): qty 0.5

## 2014-03-16 MED ORDER — SODIUM CHLORIDE 0.9 % IV SOLN
INTRAVENOUS | Status: DC
  Administered 2014-03-16: 14:00:00 via INTRAVENOUS

## 2014-03-16 MED ORDER — VITAMIN C 500 MG PO TABS
500.0000 mg | ORAL_TABLET | Freq: Every day | ORAL | Status: DC
  Administered 2014-03-17: 11:00:00 500 mg via ORAL
  Filled 2014-03-16 (×2): qty 1

## 2014-03-16 MED ORDER — ASPIRIN 81 MG PO CHEW
324.0000 mg | CHEWABLE_TABLET | ORAL | Status: AC
  Administered 2014-03-16: 324 mg via ORAL
  Filled 2014-03-16: qty 4

## 2014-03-16 MED ORDER — MIDAZOLAM HCL 2 MG/2ML IJ SOLN
INTRAMUSCULAR | Status: AC
  Filled 2014-03-16: qty 2

## 2014-03-16 MED ORDER — NITROGLYCERIN 1 MG/10 ML FOR IR/CATH LAB
INTRA_ARTERIAL | Status: AC
  Filled 2014-03-16: qty 10

## 2014-03-16 MED ORDER — LIDOCAINE HCL (PF) 1 % IJ SOLN
INTRAMUSCULAR | Status: AC
  Filled 2014-03-16: qty 30

## 2014-03-16 MED ORDER — FLUTICASONE PROPIONATE 50 MCG/ACT NA SUSP
1.0000 | Freq: Every day | NASAL | Status: DC
  Filled 2014-03-16: qty 16

## 2014-03-16 NOTE — CV Procedure (Signed)
Procedure performed:  Left heart catheterization including hemodynamic monitoring of the left ventricle, LV gram. Selective right and left coronary arteriography. PTCA and stenting of the mid LAD with implantation of a 3.0 x 23 mm Xience Alpine DES.  Indication: Patient is a 73 year-old Caucasian male with history of hypertension,  hyperlipidemia, who presents with symptoms suggestive of intermediate coronary syndrome, had chest pain just before his stress testing and was in atrial fibrillation briefly, converted back to sinus rhythm, stress testing had revealed mid to distal anterior and anteroapical and septal ischemia, severe degree. Hence he was admitted to the hospital and brought to the cardiac catheterization lab the same day to evaluate his coronary anatomy.  Hemodynamic data: Left ventricular pressure was 127/1 with LVEDP of 6 mm mercury. Aortic pressure was 126/66 with a mean of 91 mm mercury. There was no pressure gradient across the aortic valve.   Left ventricle: Performed in the RAO projection revealed LVEF of 55-60 %. There was no significant MR. no wall motion abnormality.  Right coronary artery: The vessel is smooth, normal, non-Dominant.  Left main coronary artery is large and normal. There is mild calcification. It is short.  Circumflex coronary artery: A large vessel giving origin to a large obtuse marginal 1. It is dominant vessel. There is mild to moderate 20-30% diffuse luminal irregularity in a tandem fashion in the circumflex and marginal vessels. PDA also has mild disease. There is mild proximal coronary calcification evident.  LAD:  LAD gives origin to a small to moderate sized diagonal-1 which is occluded and faintly fills. The proximal and midsegment of the LAD is moderately calcified and proximal segment of the LAD has eccentric 40-50% stenosis and there is diffuse 20-30% long lesion. Midsegment of the LAD has a subtotal 95-99% stenosis. Mid to distal LAD has mild  luminal irregularity.   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 Proximal LAD has moderate disease, given stress test which shows apical and distal anterior ischemia, mid LAD stenosis appears to be a significant lesion. We'll proceed with intervention of same. Diagonal one a small, hence we'll leave the lesion alone for medical therapy.  Interventional data: Successful  PTCA and stenting of the mid LAD with implantation of a 3.0 x 23 mm Xience Alpine DES. Will need Dual antiplatelet therapy with BRILINTA and ASA 81 mg for at least one year.   Technique of diagnostic cardiac catheterization:  Under sterile precautions using a 6 French right radial  arterial access, a 6 French sheath was introduced into the right radial artery. A 5 [place] Tig 4 catheter was advanced into the ascending aorta selective  right coronary artery and left coronary artery was cannulated and angiography was performed in multiple views. The catheter was pulled back Out of the body over exchange length J-wire. Same Catheter was used to perform LV gram which was performed in RAO projection. Catheter exchanged out of the body over J-Wire. NO immediate complications noted.    Technique of intervention:  Using a 6 [place] XB 3.5 guide catheter the left main  coronary  was selected and cannulated. Using Angiomax for anticoagulation, I utilized a cougar XT guidewire and across the LAD coronary artery without any difficulty. I placed the tip of the wire into the distal  coronary artery. Angiography was performed.   Then I utilized a 2.5 x 15 mm Trek balloon , I performed balloon angioplasty at 14 pressure x 2 for 50 and 30 seconds seconds each.  I proceeded with implantation of a 3.0 x 23 mm Xience  drug-eluting stent into the mid LAD coronary artery. The stent was deployed at 12 atmospheric pressure for 60  seconds.  Post-angioplasty results were excellent with 0% residual stenoses and TIMI-3 flow was  maintained. There was no evidence of edge dissection. The guidewire was withdrawn out of the body and the guide catheter was engaged and pulled out of the body over the J-wire the was no immediate complication. Patient tolerated the procedure well. Hemostasis was obtained by applying TR band.  Disposition: Patient will be discharged in morning unless complications with out-patient follow up.

## 2014-03-16 NOTE — H&P (Signed)
Vincent [name] is an 73 y.o. male.   Chief Complaint: Chest pain HOPI: patient had presented to our office this morning for a stress test.  While he was waiting in the waiting room, he complained of chest pain with radiation to the neck and had to use sublingual nitroglycerin.  I brought him in and evaluated him immediately, an EKG that was obtained revealed atrial fibrillation with rapid ventricular response.  His chest pain symptoms resolved with nitroglycerin, and he was able to complete a Lexiscan Myoview stress test.  The stress test revealed mid to distal anterior and septal ischemia, severe degree with preserved ejection fraction.  Fortunately patient also converted back to sinus rhythm spontaneously.  Patient was seen previously in or office about 2-3 weeks ago.  He is a 73 year old male who presents with chest pain. Vincent [name] is 73 years old white male. He has felt tightness in the neck, lower jaw and head associated with tingling in the left arm off and on for past 2 months. Patient initially thought that it was due to reflux symptoms, the dose of omeprazole was doubled & he felt better. However, recently he has felt similar symptoms in neck and upper substernal region when he is walking in the gym for one to 2 miles. Recently, he was on trip to [place] and while he was walking with his backpack, felt similar tightness in the neck and tingling sensation in the left arm. Symptoms usually subside in 10-15 minutes. There is no associated dyspnea, diaphoresis or nausea. He takes Tums with improvement in the symptoms.  Patient denies any symptoms of shortness of breath, orthopnea or PND. He has been feeling more tired and fatigued for past 3 months. Patient said that he has lost 5 pounds but his appetite is normal. Patient said that because of GERD, he has been eating very bland food.  He had palpitation twice, woke up in night with rapid heartbeat. It subsided in a few minutes. He did not have  associated sweating, chest pain, dizziness or near syncope.  No complaints of swelling on the legs and no claudication.  Patient has hypertension, prediabetes (his hemoglobin A1c was 6.1) and hypercholesterolemia. He does not smoke. He has history of GERD. Patient exercises in the gym, walks for one to 2 miles, but has not done for past few days.  No history of thyroid problems. No history of TIA or CVA.   Past Medical History  Diagnosis Date  . Allergy     seasonal  . Cancer     basal cell/squamouscell  . GERD (gastroesophageal reflux disease)   . Hyperlipidemia   . Hypertension   . Prostate cancer Nov.-Dec.2000    radiation    Past Surgical History  Procedure Laterality Date  . Prostate surgery  01/1999  . Insertion prostate radiation seed  06/1999  . Nasal septum surgery  12/1997    also polyps removed    No family history on file. Social History:  reports that he has quit smoking. He has never used smokeless tobacco. He reports that he drinks about 6 ounces of alcohol per week. He reports that he does not use illicit drugs.  Allergies: No Known Allergies  Medications Prior to Admission  Medication Sig Dispense Refill  . aspirin 81 MG tablet Take 81 mg by mouth daily.        . bethanechol (URECHOLINE) 25 MG tablet Take 75 mg by mouth daily.      . bisoprolol-hydrochlorothiazide (  ZIAC) 5-6.25 MG per tablet Take 1 tablet by mouth daily.      . calcium carbonate (OS-CAL) 600 MG TABS Take 1,200 mg by mouth daily.        . Cholecalciferol (VITAMIN D) 2000 UNITS tablet Take 6,000 Units by mouth daily.        Marland Kitchen doxazosin (CARDURA) 4 MG tablet Take 4 mg by mouth at bedtime.        Marland Kitchen HYDROcodone-acetaminophen (NORCO/VICODIN) 5-325 MG per tablet Take 1-2 tablets by mouth every 6 (six) hours as needed for pain. Pain      . Misc Natural Products (PUMPKIN SEED OIL) CAPS Take 1 capsule by mouth daily.        . mometasone (NASONEX) 50 MCG/ACT nasal spray Place 1 spray into the nose  daily.        . Multiple Vitamins-Minerals (MULTIVITAMIN WITH MINERALS) tablet Take 1 tablet by mouth daily.        Marland Kitchen omeprazole (PRILOSEC) 20 MG capsule Take 1 tablet by mouth Daily.      . pravastatin (PRAVACHOL) 40 MG tablet Take 20 mg by mouth Daily.      . psyllium (REGULOID) 0.52 G capsule Take 0.52 g by mouth daily.        . vitamin C (ASCORBIC ACID) 500 MG tablet Take 500 mg by mouth daily.        . vitamin E 400 UNIT capsule Take 400 Units by mouth daily.            Review of [place] tired or fatigue, No fever, chills. Has lost 5 lbs. in past 3 months. CARDIO VASCULAR- Has chest pain, No shortness of breath, orthopnea or PND. Has palpitation, No dizziness, fainting. Has hypertension and high cholesterol. No swelling on legs. No claudication in legs, No cramps. No h/o DVT PULMONARY- No cough, phlegm, wheezing, not feeling congested in chest. GASTROINTESTINAL- No abdominal pain, nausea, vomiting or diarrhea. No dark tarry stools.Normal appetite. No heartburn. No jaundice. ENDOCRINE- No Thyroid problem, No feeling of excessive heat or cold, No polydipsia or polyuria. Has Pre Diabetes. NEUROLOGICAL- No focal motor or sensory symptoms, Good coordination. No seizures.  Blood pressure 125/67, pulse 70, temperature 97.8 F (36.6 C), temperature source Oral, resp. rate 18, height 6\' 3"  (1.905 m), weight 84.3 kg (185 lb 13.6 oz), SpO2 97.00%. General Mental Status- Alert. General Appearance- Cooperative and Appears stated age. Not in acute distress. Orientation- Oriented X3. Build & Nutrition- Well built and Well nourished.   Head and Neck Thyroid Gland Characteristics- no palpable nodules. no palpable enlargement.   Chest and Lung Exam Palpation:Tender- No chest wall tenderness. Auscultation: Breath sounds:- Clear.   Cardiovascular Inspection:Jugular vein- Right- No Distention. Auscultation:Heart Sounds- S1 WNL, S2 WNL and S4 (gallop at  apex.). Murmurs & Other Heart Sounds: Murmur:- No murmur.   Abdomen Palpation/Percussion:Palpation and Percussion of the abdomen reveal - Non Tender and No hepatosplenomegaly. Auscultation:Auscultation of the abdomen reveals - Bowel sounds normal.   Peripheral Vascular Upper Extremity: Palpation:Brachial pulse- Left- Normal. Right- Normal. Radial pulse- Left- Normal. Right- Normal. Lower Extremity:Inspection- Left- No Pigmentation or Varicose veins. Right- No Pigmentation or Varicose veins. Palpation:Femoral pulse- Left- Normal. Right- Normal. Popliteal pulse- Left- Normal. Right- Normal. Dorsalis pedis pulse- Left- Normal. Right- Normal. Posterior tibial pulse- Left- Normal. Right- Normal. Edema- Left- No edema. Right- No edema. Carotid arteries- Left- No Carotid bruit. Right- No Carotid bruit. Abdomen- No prominent abdominal aortic pulsation or epigastric bruit.   Neurologic  Motor:- Grossly intact without any focal deficits.   Musculoskeletal Global Assessment Left Lower Extremity - normal range of motion without pain. Right Lower Extremity - normal range of motion without pain. Right Lower Extremity- normal range of motion without pain.  EKG 03/16/2014: Atrial fibrillation with rapid ventricular response at the rate of 126 bpm.  Normal axis.  No evidence of ischemia.  LVH. EKG 03/05/2014 - S. [place], HR-49/min, ST elevation in inf. & ant. leads, appear to be due to early repolarization, Nonsp. T wave abnormality. c.f. EKG of 03/02/14 at PCPs office, T waves are not biphasic in V5, V6.  Lexiscan Myoview stress test 03/16/2014: 1. The resting electrocardiogram demonstrated normal sinus rhythm and normal resting conduction.  The stress electrocardiogram was non-diagnostic due pharmacologic stress. Stress symptoms included dyspnea. 2. The perfusion imaging study demonstrates the t.i.d. index to be 0.65.  The left ventricle was normal in size both in  rest and stress images.  There was mild apical thinning.  Stress images reveal a moderate sized severe ischemia in the distal anterior, anteroapical, mid to distal anteroseptal region.  The dynamic QGS images reveal preserved LV systolic function at 84%. This represents at least an intermediate risk study, clinical correlation is recommended.  Assessment/Plan 1.  Chest pain suggestive of angina pectoris.  Patient symptoms are suggestive of unstable angina with new onset of angina pectoris, patient also has abnormal stress test. 2.  Paroxysmal symptomatic atrial fibrillation with rapid ventricular response associated with chest tightness, suspect ischemic etiology for his atrial fibrillation. 3.  Hypertension  4.  Hyperlipidemia 5.  History of GERD without esophagitis  Recommendation: Due to his new onset of angina pectoris, abnormal stress test, I have recommended that he be hospitalized for further evaluation of coronary artery disease. His symptoms are suggestive of intermediate coronary syndrome.  I will set him up for coronary angiography and I'll make further recommendation after this.  Fortunately at the time of my evaluation, and after the stress test, patient was completely asymptomatic and he had reverted back to sinus rhythm.  Patient is aware of the risks, benefits and alternatives for coronary angiography.  I'll order some stat labs on admission to the hospital.   Laverda Page, MD 03/16/2014, 1:57 PM [facility] [place] Pager: [phone] Office: [phone] If no answer: Cell:  [phone]

## 2014-03-16 NOTE — Interval H&P Note (Signed)
History and Physical Interval Note:  03/16/2014 3:32 PM  Vincent [name]  has presented today for surgery, with the diagnosis of cp  The various methods of treatment have been discussed with the patient and family. After consideration of risks, benefits and other options for treatment, the patient has consented to  Procedure(s): LEFT HEART CATHETERIZATION WITH CORONARY ANGIOGRAM (N/A) and possible PCI  as a surgical intervention .  The patient's history has been reviewed, patient examined, no change in status, stable for surgery.  I have reviewed the patient's chart and labs.  Questions were answered to the patient's satisfaction.   Cath Lab Visit (complete for each Cath Lab visit)  Clinical Evaluation Leading to the Procedure:   ACS: Yes.    Non-ACS:    Anginal Classification: CCS IV  Anti-ischemic medical therapy: Minimal Therapy (1 class of medications)  Non-Invasive Test Results: Intermediate-risk stress test findings: cardiac mortality 1-3%/year  Prior CABG: No previous CABG        [facility] R

## 2014-03-17 DIAGNOSIS — I25110 Atherosclerotic heart disease of native coronary artery with unstable angina pectoris: Secondary | ICD-10-CM | POA: Diagnosis not present

## 2014-03-17 DIAGNOSIS — E785 Hyperlipidemia, unspecified: Secondary | ICD-10-CM | POA: Diagnosis not present

## 2014-03-17 DIAGNOSIS — K219 Gastro-esophageal reflux disease without esophagitis: Secondary | ICD-10-CM | POA: Diagnosis not present

## 2014-03-17 DIAGNOSIS — I10 Essential (primary) hypertension: Secondary | ICD-10-CM | POA: Diagnosis not present

## 2014-03-17 LAB — CBC
HEMATOCRIT: 37.6 % — AB (ref 39.0–52.0)
HEMOGLOBIN: 12.2 g/dL — AB (ref 13.0–17.0)
MCH: 27.7 pg (ref 26.0–34.0)
MCHC: 32.4 g/dL (ref 30.0–36.0)
MCV: 85.3 fL (ref 78.0–100.0)
Platelets: 121 10*3/uL — ABNORMAL LOW (ref 150–400)
RBC: 4.41 MIL/uL (ref 4.22–5.81)
RDW: 13.4 % (ref 11.5–15.5)
WBC: 5.7 10*3/uL (ref 4.0–10.5)

## 2014-03-17 LAB — BASIC METABOLIC PANEL
Anion gap: 12 (ref 5–15)
BUN: 15 mg/dL (ref 6–23)
CALCIUM: 8.6 mg/dL (ref 8.4–10.5)
CO2: 25 meq/L (ref 19–32)
CREATININE: 0.9 mg/dL (ref 0.50–1.35)
Chloride: 106 mEq/L (ref 96–112)
GFR calc Af Amer: >90 mL/min (ref >90)
GFR calc non Af Amer: 81 mL/min — ABNORMAL LOW (ref >90)
GLUCOSE: 85 mg/dL (ref 70–99)
Potassium: 4.1 mEq/L (ref 3.7–5.3)
Sodium: 143 mEq/L (ref 137–147)

## 2014-03-17 MED ORDER — DILTIAZEM HCL ER COATED BEADS 120 MG PO TB24
240.0000 mg | ORAL_TABLET | Freq: Every day | ORAL | Status: DC
  Administered 2014-03-17: 11:00:00 240 mg via ORAL
  Filled 2014-03-17 (×3): qty 2

## 2014-03-17 MED ORDER — DILTIAZEM HCL ER COATED BEADS 240 MG PO CP24
240.0000 mg | ORAL_CAPSULE | Freq: Every day | ORAL | Status: DC
  Filled 2014-03-17: qty 1

## 2014-03-17 MED ORDER — DILTIAZEM HCL ER COATED BEADS 120 MG PO TB24
240.0000 mg | ORAL_TABLET | Freq: Every day | ORAL | Status: DC
Start: 2014-03-17 — End: 2014-03-29

## 2014-03-17 MED ORDER — TICAGRELOR 90 MG PO TABS: 90.0000 mg | ORAL_TABLET | Freq: Two times a day (BID) | ORAL | Status: DC

## 2014-03-17 MED ORDER — ISOSORBIDE MONONITRATE ER 30 MG PO TB24: 30.0000 mg | ORAL_TABLET | Freq: Every day | ORAL | Status: DC

## 2014-03-17 NOTE — Progress Notes (Signed)
CARDIAC REHAB PHASE I   PRE:  Rate/Rhythm: 69   BP:  Sitting: 122/60     SaO2: 96% RA  MODE:  Ambulation: 700 ft   POST:  Rate/Rhythm: 88  BP:  Sitting: 139/59     SaO2: 95% RA  9:25am-10:10am  Patient ambulated independently with no complaints of shortness of breath, chest pain, or dizziness.  Patient stated he felt good and and strong.  Patient is very interested in Cardiac Rehab at [facility]. Education was conducted with wife in the room.    Vita Erm, MS 03/17/2014 10:07 AM

## 2014-03-17 NOTE — Discharge Summary (Signed)
Physician Discharge Summary  Patient ID: Vincent [name] MRN: [id number] DOB/AGE: [birth year] 73 y.o.  Admit date: 03/16/2014 Discharge date: 03/17/2014  Admission Diagnoses: Chest pain suggestive of angina pectoris. Patient symptoms are suggestive of unstable angina with new onset of angina pectoris, patient also has abnormal stress test.  Paroxysmal symptomatic atrial fibrillation with rapid ventricular response associated with chest tightness, suspect ischemic etiology for his atrial fibrillation.  Hypertension  Hyperlipidemia  History of GERD without esophagitis  Discharge Diagnoses:  Principle Problem: Unstable angina Native vessel coronary artery disease Stent placement in LAD Paroxysmal symptomatic atrial flutter with rapid ventricular response   Hypertension  Hyperlipidemia  History of GERD without esophagitis   Discharged Condition: good  Hospital Course: 73 year old male was admitted with unstable angina and paroxysmal atrial fibrillation with rapid ventricular response. He underwent cardiac catheterization that showed 95 % mid LAD lesion treated with 3.0 X 23 Xience Alpine DES. He had few episodes of atrial flutter with RVR. His TSH was normal. His Imdur was decreased by 50 %. His amlodipine was changed to diltiazem 240 mg. one daily along with Brillinta, aspirin and B-blocker use. He will be followed by Dr. Einar Gip in 10 days and Primary care Dr. Marisue Humble in 1 month.  Consults: cardiology  Significant Diagnostic Studies: Near normal CBC and BMET.  EKG-SR. Monitor-Paroxysmal atrial flutter.  Coronary angiography: 95 % mid LAD disease, mild LCX disease and mid LAD DES placement.  Treatments: cardiac meds: Bisoprolol, Aspirin, Diltiazem, Brillinta and Pravachol.  Discharge Exam: Blood pressure 124/54, pulse 70, temperature 97.4 F (36.3 C), temperature source Oral, resp. rate 18, height 6\' 3"  (1.905 m), weight 85.2 kg (187 lb 13.3 oz), SpO2 94.00%.  General  Mental Status -  Alert. General Appearance - Cooperative and Appears stated age. Not in acute distress. Orientation - Oriented X3. Build & Nutrition - Well built and Well nourished.  Head and Neck: /AT, PERL, Conj-pink, Sclera-white. Thyroid - no palpable nodules. no palpable enlargement.  Chest and Lung Exam Auscultation: Breath sounds: - Clear.  Cardiovascular Inspection: Jugular vein - Right - No Distention. Auscultation: Heart Sounds - S1 WNL, S2 WNL and S4 (gallop at apex.). II/VI systolic murmur. Abdomen - Non Tender and No hepatosplenomegaly. Bowel sounds normal.  Peripheral Vascular Upper Extremity: No right radial ecchymoses.  Neurologic Motor: - Grossly intact without any focal deficits. Moves all 4 extremities.     Disposition: 01-Home or Self Care  Discharge Instructions   Amb Referral to Cardiac Rehabilitation    Complete by:  As directed             Medication List         aspirin 81 MG tablet  Take 81 mg by mouth daily.     bethanechol 25 MG tablet  Commonly known as:  URECHOLINE  Take 25 mg by mouth 2 (two) times daily.     bisoprolol-hydrochlorothiazide 5-6.25 MG per tablet  Commonly known as:  ZIAC  Take 0.5 tablets by mouth daily.     calcium carbonate 600 MG Tabs tablet  Commonly known as:  OS-CAL  Take 600 mg by mouth 2 (two) times daily with a meal.     diltiazem 120 MG 24 hr tablet  Commonly known as:  CARDIZEM LA  Take 2 tablets (240 mg total) by mouth daily.     doxazosin 4 MG tablet  Commonly known as:  CARDURA  Take 4 mg by mouth at bedtime.     fluticasone 50 MCG/ACT  nasal spray  Commonly known as:  FLONASE  Place 1 spray into both nostrils daily.     isosorbide mononitrate 30 MG 24 hr tablet  Commonly known as:  IMDUR  Take 1 tablet (30 mg total) by mouth daily.  Start taking on:  03/18/2014     multivitamin with minerals tablet  Take 1 tablet by mouth daily.     NITROSTAT 0.4 MG SL tablet  Generic drug:  nitroGLYCERIN  Place 0.4 mg under the  tongue every 5 (five) minutes as needed for chest pain.     omeprazole 20 MG capsule  Commonly known as:  PRILOSEC  Take 1 tablet by mouth daily.     pravastatin 40 MG tablet  Commonly known as:  PRAVACHOL  Take 20 mg by mouth Daily.     psyllium 0.52 G capsule  Commonly known as:  REGULOID  Take 0.52 g by mouth daily.     Pumpkin Seed Oil Caps  Take 1 capsule by mouth daily.     ticagrelor 90 MG Tabs tablet  Commonly known as:  BRILINTA  Take 1 tablet (90 mg total) by mouth 2 (two) times daily.     vitamin C 500 MG tablet  Commonly known as:  ASCORBIC ACID  Take 500 mg by mouth daily.     Vitamin D 2000 UNITS tablet  Take 2,000 Units by mouth daily.     vitamin E 400 UNIT capsule  Take 400 Units by mouth daily.           Follow-up Information   Follow up with Simona Huh, MD. Schedule an appointment as soon as possible for a visit in 1 month.   Specialty:  Family Medicine   Contact information:   [address], [place] [address] [phone]       Follow up with Laverda Page, MD. Schedule an appointment as soon as possible for a visit in 10 days.   Specialty:  Cardiology   Contact information:   [place] [address] [place] [address] [phone]       Signed: Birdie Riddle 03/17/2014, 10:23 AM

## 2014-03-17 NOTE — Progress Notes (Signed)
TR BAND REMOVAL  LOCATION:    right radial  DEFLATED PER PROTOCOL:    Yes.    TIME BAND OFF / DRESSING APPLIED:    21:45   SITE UPON ARRIVAL:    Level 1  SITE AFTER BAND REMOVAL:    Level 1  REVERSE ALLEN'S TEST:     positive  CIRCULATION SENSATION AND MOVEMENT:    Within Normal Limits   Yes.    COMMENTS:   [place]

## 2014-03-17 NOTE — Progress Notes (Signed)
Utilization Review completed.  

## 2014-03-17 NOTE — Progress Notes (Signed)
Patient started on diltiazem this morning and tolerated well bp 111/63 and heart rate 70 NSR denies any distress.

## 2014-03-17 NOTE — Care Management Note (Signed)
    Page 1 of 1   03/17/2014     8:35:30 AM CARE MANAGEMENT NOTE 03/17/2014  Patient:  AIREN, STIEHL A   Account Number:  [id number]  Date Initiated:  03/17/2014  Documentation initiated by:  [facility]  Subjective/Objective Assessment:   adm: Left heart catheterization including hemodynamic monitoring of the left ventricle, LV gram     Action/Plan:   med asst/discharge planning   Anticipated DC Date:  03/17/2014   Anticipated DC Plan:  [place]  CM consult  Medication Assistance      Choice offered to / List presented to:             Status of service:  Completed, signed off Medicare Important Message given?   (If response is "NO", the following Medicare IM given date fields will be blank) Date Medicare IM given:   Medicare IM given by:   Date Additional Medicare IM given:   Additional Medicare IM given by:    Discharge Disposition:  HOME/SELF CARE  Per UR Regulation:    If discussed at Long Length of Stay Meetings, dates discussed:    Comments:  03/17/14 08:15 CM met with pt in room to give pt Brilinta card.  Pt verbalized understanding the 30 day free trial will give enough time to have his PCP office staff ensure his insrance company has authorized the medication.  No insurance verification can be accomplished on the weekend. No other CM needs were communicated.  Mariane Masters, BSN, [place].

## 2014-03-19 MED FILL — Sodium Chloride IV Soln 0.9%: INTRAVENOUS | Qty: 50 | Status: AC

## 2014-03-29 ENCOUNTER — Encounter (HOSPITAL_COMMUNITY)
Admission: RE | Admit: 2014-03-29 | Discharge: 2014-03-29 | Disposition: A | Discharge status: Home / Self Care | Payer: Medicare Other | Source: Ambulatory Visit | Attending: Cardiology | Admitting: Cardiology

## 2014-03-29 DIAGNOSIS — E785 Hyperlipidemia, unspecified: Secondary | ICD-10-CM | POA: Insufficient documentation

## 2014-03-29 DIAGNOSIS — Z955 Presence of coronary angioplasty implant and graft: Secondary | ICD-10-CM | POA: Insufficient documentation

## 2014-03-29 DIAGNOSIS — I2510 Atherosclerotic heart disease of native coronary artery without angina pectoris: Secondary | ICD-10-CM | POA: Insufficient documentation

## 2014-03-29 DIAGNOSIS — Z8546 Personal history of malignant neoplasm of prostate: Secondary | ICD-10-CM | POA: Insufficient documentation

## 2014-03-29 DIAGNOSIS — I10 Essential (primary) hypertension: Secondary | ICD-10-CM | POA: Insufficient documentation

## 2014-03-29 DIAGNOSIS — K219 Gastro-esophageal reflux disease without esophagitis: Secondary | ICD-10-CM | POA: Insufficient documentation

## 2014-03-29 DIAGNOSIS — Z923 Personal history of irradiation: Secondary | ICD-10-CM | POA: Insufficient documentation

## 2014-03-29 DIAGNOSIS — Z5189 Encounter for other specified aftercare: Secondary | ICD-10-CM | POA: Insufficient documentation

## 2014-03-29 NOTE — Progress Notes (Signed)
Cardiac Rehab Medication Review by a Pharmacist  Does the patient  feel that his/her medications are working for him/her?  yes  Has the patient been experiencing any side effects to the medications prescribed?  no  Does the patient measure his/her own blood pressure or blood glucose at home?  yes   Does the patient have any problems obtaining medications due to transportation or finances?   no  Understanding of regimen: good Understanding of indications: good Potential of compliance: good  Pharmacist comments: Vincent [name] is a very pleasant man who has no barriers to obtaining his medications.  He is not experiencing any side effects with any of this medications and measures his blood pressure at home everyday.  He always remembers to take his medications with the help of a pill box and his wife.    [name], PharmD Clinical Pharmacy Resident Pager: [phone] 03/29/2014 8:43 AM

## 2014-04-02 ENCOUNTER — Encounter (HOSPITAL_COMMUNITY): Payer: Medicare Other

## 2014-04-04 ENCOUNTER — Encounter (HOSPITAL_COMMUNITY)
Admission: RE | Admit: 2014-04-04 | Discharge: 2014-04-04 | Disposition: A | Discharge status: Home / Self Care | Payer: Medicare Other | Source: Ambulatory Visit | Attending: Cardiology | Admitting: Cardiology

## 2014-04-04 DIAGNOSIS — Z923 Personal history of irradiation: Secondary | ICD-10-CM | POA: Diagnosis not present

## 2014-04-04 DIAGNOSIS — Z5189 Encounter for other specified aftercare: Secondary | ICD-10-CM | POA: Diagnosis present

## 2014-04-04 DIAGNOSIS — I2510 Atherosclerotic heart disease of native coronary artery without angina pectoris: Secondary | ICD-10-CM | POA: Diagnosis not present

## 2014-04-04 DIAGNOSIS — E785 Hyperlipidemia, unspecified: Secondary | ICD-10-CM | POA: Diagnosis not present

## 2014-04-04 DIAGNOSIS — Z8546 Personal history of malignant neoplasm of prostate: Secondary | ICD-10-CM | POA: Diagnosis not present

## 2014-04-04 DIAGNOSIS — K219 Gastro-esophageal reflux disease without esophagitis: Secondary | ICD-10-CM | POA: Diagnosis not present

## 2014-04-04 DIAGNOSIS — Z955 Presence of coronary angioplasty implant and graft: Secondary | ICD-10-CM | POA: Diagnosis not present

## 2014-04-04 DIAGNOSIS — I10 Essential (primary) hypertension: Secondary | ICD-10-CM | POA: Diagnosis not present

## 2014-04-04 NOTE — Progress Notes (Signed)
Pt in today for his first day of exercise at the Cardiac Rehab phase II program.  Pt tolerated light exercise with no complaints. Monitor showed Sr with no noted ectopy.  PHQ2 score 0.  Pt demonstrates appropriate and positive behavior and outlook in regards to his recovery.  Medication list reconciled. Pt verbalizes compliance with his medication regimen.  Short term goal for pt is to build strength back up.  Will monitor pt progression toward this goal with MET level reassessments every two weeks.  Will review home exercise with pt by the EP, will monitor pt tolerance to exercise. Pt long term goal is walking 2 miles a day, upper body strength and increase stamina. Will monitor his progression toward this goal. Maurice Small RN, BSN

## 2014-04-06 ENCOUNTER — Encounter (HOSPITAL_COMMUNITY)
Admission: RE | Admit: 2014-04-06 | Discharge: 2014-04-06 | Disposition: A | Discharge status: Home / Self Care | Payer: Medicare Other | Source: Ambulatory Visit | Attending: Cardiology | Admitting: Cardiology

## 2014-04-06 DIAGNOSIS — Z5189 Encounter for other specified aftercare: Secondary | ICD-10-CM | POA: Diagnosis not present

## 2014-04-06 NOTE — Progress Notes (Addendum)
Patient was noted to have questionable atrial fib/ atrial aflutter today during bike testing today at cardiac rehab maximum heart rate 140's nonsustained today during bike testing.  Patient complained of feeling tired otherwise patient is asymptomatic.  Exercise stopped. Blood pressure 162/60 then 124/40. Patient spontaneously converted to Sinus Rhythm in the 60's. Dr Irven Shelling office called and notfied. Will fax exercise flow sheets to Dr. Irven Shelling office for review with today's ECG tracings. Rider took his diltiazem last night and all other prescribed medications. Dr Einar Gip reviewed the strips and asked that Mr Mancera come to his office for evaluation now. Dr Einar Gip says he will put Mr Saling on an anticoagulant. Rajveer had another nonsustained burst of atrial fib while I was speaking to Dr Einar Gip on the phone. Exit heart rate 62 Sinus Blood pressure 98/60. Patient's wife is with the patient and will drive him over to Dr Irven Shelling office for evaluation.

## 2014-04-09 ENCOUNTER — Encounter (HOSPITAL_COMMUNITY)
Admission: RE | Admit: 2014-04-09 | Discharge: 2014-04-09 | Disposition: A | Discharge status: Home / Self Care | Payer: Medicare Other | Source: Ambulatory Visit | Attending: Cardiology | Admitting: Cardiology

## 2014-04-09 ENCOUNTER — Telehealth (HOSPITAL_COMMUNITY): Payer: Self-pay | Admitting: *Deleted

## 2014-04-09 DIAGNOSIS — Z5189 Encounter for other specified aftercare: Secondary | ICD-10-CM | POA: Insufficient documentation

## 2014-04-09 DIAGNOSIS — K219 Gastro-esophageal reflux disease without esophagitis: Secondary | ICD-10-CM | POA: Insufficient documentation

## 2014-04-09 DIAGNOSIS — E785 Hyperlipidemia, unspecified: Secondary | ICD-10-CM | POA: Insufficient documentation

## 2014-04-09 DIAGNOSIS — I2510 Atherosclerotic heart disease of native coronary artery without angina pectoris: Secondary | ICD-10-CM | POA: Diagnosis not present

## 2014-04-09 DIAGNOSIS — Z8546 Personal history of malignant neoplasm of prostate: Secondary | ICD-10-CM | POA: Insufficient documentation

## 2014-04-09 DIAGNOSIS — Z955 Presence of coronary angioplasty implant and graft: Secondary | ICD-10-CM | POA: Insufficient documentation

## 2014-04-09 DIAGNOSIS — Z923 Personal history of irradiation: Secondary | ICD-10-CM | POA: Diagnosis not present

## 2014-04-09 DIAGNOSIS — I10 Essential (primary) hypertension: Secondary | ICD-10-CM | POA: Insufficient documentation

## 2014-04-09 NOTE — Progress Notes (Signed)
Pt returned to cardiac rehab today after office visit on Friday.  Pt with new medications. Increase Cardizem ER 120mg  twice a day.  D/c Brilinta. Changed to plavix and Xeralto for dual coverage.  Monitor shows pt in and out of afib/flutter and NSR.  Pt denies any symptoms but can feel the irregular beats when he is sitting quietly. Tolerated exercise well with no complaints.  Weight level up 1.7 kg from Friday.  Pt attributes this to heavier eating over the weekend.  Lungs clear bilat, o2 sat 96.  Has some ring tightness noted. Cherre Huger, BSN

## 2014-04-09 NOTE — Telephone Encounter (Signed)
-----   Message from Magda Kiel, RN sent at 04/09/2014  9:29 AM EST ----- Regarding: FW: cardiac rehab   ----- Message -----    From: Laverda Page, MD    Sent: 04/08/2014   7:44 AM      To: Magda Kiel, RN Subject: RE: cardiac rehab                              Yes please. No limitation  ----- Message -----    From: Magda Kiel, RN    Sent: 04/06/2014   5:19 PM      To: Laverda Page, MD Subject: cardiac rehab                                  Dr Einar Gip,  Are you okay with Mr Szumski returning to exercise on Monday?  Thanks for your help today?   Verdis Frederickson

## 2014-04-11 ENCOUNTER — Encounter (HOSPITAL_COMMUNITY)
Admission: RE | Admit: 2014-04-11 | Discharge: 2014-04-11 | Disposition: A | Discharge status: Home / Self Care | Payer: Medicare Other | Source: Ambulatory Visit | Attending: Cardiology | Admitting: Cardiology

## 2014-04-11 DIAGNOSIS — Z5189 Encounter for other specified aftercare: Secondary | ICD-10-CM | POA: Diagnosis not present

## 2014-04-11 NOTE — Progress Notes (Signed)
Reviewed home exercise with pt today.  Pt plans to walk at home and continue going to [facility] for exercise.  Reviewed THR, pulse, RPE, sign and symptoms, NTG use, and when to call 911 or MD.  Pt voiced understanding. Alberteen Sam, MA, ACSM RCEP

## 2014-04-13 ENCOUNTER — Encounter (HOSPITAL_COMMUNITY)
Admission: RE | Admit: 2014-04-13 | Discharge: 2014-04-13 | Disposition: A | Discharge status: Home / Self Care | Payer: Medicare Other | Source: Ambulatory Visit | Attending: Cardiology | Admitting: Cardiology

## 2014-04-13 DIAGNOSIS — Z5189 Encounter for other specified aftercare: Secondary | ICD-10-CM | POA: Diagnosis not present

## 2014-04-16 ENCOUNTER — Encounter (HOSPITAL_COMMUNITY): Payer: Medicare Other

## 2014-04-18 ENCOUNTER — Encounter (HOSPITAL_COMMUNITY): Payer: Medicare Other

## 2014-04-18 ENCOUNTER — Telehealth (HOSPITAL_COMMUNITY): Payer: Self-pay | Admitting: Family Medicine

## 2014-04-20 ENCOUNTER — Encounter (HOSPITAL_COMMUNITY)
Admission: RE | Admit: 2014-04-20 | Discharge: 2014-04-20 | Disposition: A | Discharge status: Home / Self Care | Payer: Medicare Other | Source: Ambulatory Visit | Attending: Cardiology | Admitting: Cardiology

## 2014-04-20 DIAGNOSIS — Z5189 Encounter for other specified aftercare: Secondary | ICD-10-CM | POA: Diagnosis not present

## 2014-04-23 ENCOUNTER — Encounter (HOSPITAL_COMMUNITY)
Admission: RE | Admit: 2014-04-23 | Discharge: 2014-04-23 | Disposition: A | Discharge status: Home / Self Care | Payer: Medicare Other | Source: Ambulatory Visit | Attending: Cardiology | Admitting: Cardiology

## 2014-04-23 DIAGNOSIS — Z5189 Encounter for other specified aftercare: Secondary | ICD-10-CM | POA: Diagnosis not present

## 2014-04-25 ENCOUNTER — Encounter (HOSPITAL_COMMUNITY)
Admission: RE | Admit: 2014-04-25 | Discharge: 2014-04-25 | Disposition: A | Discharge status: Home / Self Care | Payer: Medicare Other | Source: Ambulatory Visit | Attending: Cardiology | Admitting: Cardiology

## 2014-04-25 DIAGNOSIS — Z5189 Encounter for other specified aftercare: Secondary | ICD-10-CM | POA: Diagnosis not present

## 2014-04-27 ENCOUNTER — Encounter (HOSPITAL_COMMUNITY)
Admission: RE | Admit: 2014-04-27 | Discharge: 2014-04-27 | Disposition: A | Discharge status: Home / Self Care | Payer: Medicare Other | Source: Ambulatory Visit | Attending: Cardiology | Admitting: Cardiology

## 2014-04-27 DIAGNOSIS — Z5189 Encounter for other specified aftercare: Secondary | ICD-10-CM | POA: Diagnosis not present

## 2014-04-30 ENCOUNTER — Encounter (HOSPITAL_COMMUNITY)
Admission: RE | Admit: 2014-04-30 | Discharge: 2014-04-30 | Disposition: A | Discharge status: Home / Self Care | Payer: Medicare Other | Source: Ambulatory Visit | Attending: Cardiology | Admitting: Cardiology

## 2014-04-30 DIAGNOSIS — Z5189 Encounter for other specified aftercare: Secondary | ICD-10-CM | POA: Diagnosis not present

## 2014-04-30 NOTE — Progress Notes (Signed)
Vincent [name] 73 y.o. male Nutrition Note Spoke with pt.  Nutrition Survey reviewed with pt. Pt is following Step 2 of the Therapeutic Lifestyle Changes diet. Pt expressed understanding of the information reviewed. Pt aware of nutrition education classes offered.  Nutrition Diagnosis ? Food-and nutrition-related knowledge deficit related to lack of exposure to information as related to diagnosis of: ? CVD   Nutrition Intervention ? Benefits of adopting Therapeutic Lifestyle Changes discussed when Medficts reviewed. ? Pt to attend the Portion Distortion class ? Pt to attend the  ? Nutrition I class - met; 04/10/14                    ? Nutrition II class ? Pt given handouts for: ? Nutrition II class ? Continue client-centered nutrition education by RD, as part of interdisciplinary care.  Goal(s) ? Pt to describe the benefit of including fruits, vegetables, whole grains, and low-fat dairy products in a heart healthy meal plan.  Monitor and Evaluate progress toward nutrition goal with team.   Derek Mound, M.Ed, RD, LDN, CDE 04/30/2014 12:11 PM

## 2014-05-02 ENCOUNTER — Encounter (HOSPITAL_COMMUNITY)
Admission: RE | Admit: 2014-05-02 | Discharge: 2014-05-02 | Disposition: A | Discharge status: Home / Self Care | Payer: Medicare Other | Source: Ambulatory Visit | Attending: Cardiology | Admitting: Cardiology

## 2014-05-02 DIAGNOSIS — Z5189 Encounter for other specified aftercare: Secondary | ICD-10-CM | POA: Diagnosis not present

## 2014-05-07 ENCOUNTER — Encounter (HOSPITAL_COMMUNITY)
Admission: RE | Admit: 2014-05-07 | Discharge: 2014-05-07 | Disposition: A | Discharge status: Home / Self Care | Payer: Medicare Other | Source: Ambulatory Visit | Attending: Cardiology | Admitting: Cardiology

## 2014-05-07 DIAGNOSIS — Z5189 Encounter for other specified aftercare: Secondary | ICD-10-CM | POA: Diagnosis not present

## 2014-05-09 ENCOUNTER — Encounter (HOSPITAL_COMMUNITY)
Admission: RE | Admit: 2014-05-09 | Discharge: 2014-05-09 | Disposition: A | Discharge status: Home / Self Care | Payer: Medicare Other | Source: Ambulatory Visit | Attending: Cardiology | Admitting: Cardiology

## 2014-05-09 DIAGNOSIS — Z5189 Encounter for other specified aftercare: Secondary | ICD-10-CM | POA: Diagnosis present

## 2014-05-09 DIAGNOSIS — E785 Hyperlipidemia, unspecified: Secondary | ICD-10-CM | POA: Diagnosis not present

## 2014-05-09 DIAGNOSIS — I2510 Atherosclerotic heart disease of native coronary artery without angina pectoris: Secondary | ICD-10-CM | POA: Diagnosis not present

## 2014-05-09 DIAGNOSIS — I10 Essential (primary) hypertension: Secondary | ICD-10-CM | POA: Diagnosis not present

## 2014-05-09 DIAGNOSIS — K219 Gastro-esophageal reflux disease without esophagitis: Secondary | ICD-10-CM | POA: Insufficient documentation

## 2014-05-09 DIAGNOSIS — Z923 Personal history of irradiation: Secondary | ICD-10-CM | POA: Diagnosis not present

## 2014-05-09 DIAGNOSIS — Z955 Presence of coronary angioplasty implant and graft: Secondary | ICD-10-CM | POA: Diagnosis not present

## 2014-05-09 DIAGNOSIS — Z8546 Personal history of malignant neoplasm of prostate: Secondary | ICD-10-CM | POA: Insufficient documentation

## 2014-05-11 ENCOUNTER — Encounter (HOSPITAL_COMMUNITY)
Admission: RE | Admit: 2014-05-11 | Discharge: 2014-05-11 | Disposition: A | Discharge status: Home / Self Care | Payer: Medicare Other | Source: Ambulatory Visit | Attending: Cardiology | Admitting: Cardiology

## 2014-05-11 DIAGNOSIS — Z5189 Encounter for other specified aftercare: Secondary | ICD-10-CM | POA: Diagnosis not present

## 2014-05-14 ENCOUNTER — Encounter (HOSPITAL_COMMUNITY)
Admission: RE | Admit: 2014-05-14 | Discharge: 2014-05-14 | Disposition: A | Discharge status: Home / Self Care | Payer: Medicare Other | Source: Ambulatory Visit | Attending: Cardiology | Admitting: Cardiology

## 2014-05-14 DIAGNOSIS — Z5189 Encounter for other specified aftercare: Secondary | ICD-10-CM | POA: Diagnosis not present

## 2014-05-16 ENCOUNTER — Encounter (HOSPITAL_COMMUNITY)
Admission: RE | Admit: 2014-05-16 | Discharge: 2014-05-16 | Disposition: A | Discharge status: Home / Self Care | Payer: Medicare Other | Source: Ambulatory Visit | Attending: Cardiology | Admitting: Cardiology

## 2014-05-16 DIAGNOSIS — Z5189 Encounter for other specified aftercare: Secondary | ICD-10-CM | POA: Diagnosis not present

## 2014-05-17 ENCOUNTER — Encounter (HOSPITAL_COMMUNITY): Payer: Self-pay | Admitting: Cardiology

## 2014-05-18 ENCOUNTER — Encounter (HOSPITAL_COMMUNITY)
Admission: RE | Admit: 2014-05-18 | Discharge: 2014-05-18 | Disposition: A | Discharge status: Home / Self Care | Payer: Medicare Other | Source: Ambulatory Visit | Attending: Cardiology | Admitting: Cardiology

## 2014-05-18 DIAGNOSIS — Z5189 Encounter for other specified aftercare: Secondary | ICD-10-CM | POA: Diagnosis not present

## 2014-05-21 ENCOUNTER — Encounter (HOSPITAL_COMMUNITY)
Admission: RE | Admit: 2014-05-21 | Discharge: 2014-05-21 | Disposition: A | Discharge status: Home / Self Care | Payer: Medicare Other | Source: Ambulatory Visit | Attending: Cardiology | Admitting: Cardiology

## 2014-05-21 ENCOUNTER — Encounter (HOSPITAL_COMMUNITY): Payer: Medicare Other

## 2014-05-21 NOTE — Progress Notes (Signed)
During 1115 class during cool down pt with increased hr 120-130's afib bp 118/70.  Dr. Einar Gip office called. Faxed strips over for review.  Dr. Einar Gip returned call after 10 minutes.  Will start Ammiodorone 200 mg twice a day. Dr. Einar Gip spoke to pt regarding the medication. Pt will see Dr. Einar Gip in two weeks.  Pt is in agreement of this. Cherre Huger, BSN

## 2014-05-23 ENCOUNTER — Encounter (HOSPITAL_COMMUNITY)
Admission: RE | Admit: 2014-05-23 | Discharge: 2014-05-23 | Disposition: A | Discharge status: Home / Self Care | Payer: Medicare Other | Source: Ambulatory Visit | Attending: Cardiology | Admitting: Cardiology

## 2014-05-23 DIAGNOSIS — Z5189 Encounter for other specified aftercare: Secondary | ICD-10-CM | POA: Diagnosis not present

## 2014-05-25 ENCOUNTER — Encounter (HOSPITAL_COMMUNITY)
Admission: RE | Admit: 2014-05-25 | Discharge: 2014-05-25 | Disposition: A | Discharge status: Home / Self Care | Payer: Medicare Other | Source: Ambulatory Visit | Attending: Cardiology | Admitting: Cardiology

## 2014-05-25 DIAGNOSIS — Z5189 Encounter for other specified aftercare: Secondary | ICD-10-CM | POA: Diagnosis not present

## 2014-05-28 ENCOUNTER — Encounter (HOSPITAL_COMMUNITY)
Admission: RE | Admit: 2014-05-28 | Discharge: 2014-05-28 | Disposition: A | Discharge status: Home / Self Care | Payer: Medicare Other | Source: Ambulatory Visit | Attending: Cardiology | Admitting: Cardiology

## 2014-05-28 DIAGNOSIS — Z5189 Encounter for other specified aftercare: Secondary | ICD-10-CM | POA: Diagnosis not present

## 2014-05-30 ENCOUNTER — Encounter (HOSPITAL_COMMUNITY)
Admission: RE | Admit: 2014-05-30 | Discharge: 2014-05-30 | Disposition: A | Discharge status: Home / Self Care | Payer: Medicare Other | Source: Ambulatory Visit | Attending: Cardiology | Admitting: Cardiology

## 2014-05-30 DIAGNOSIS — Z5189 Encounter for other specified aftercare: Secondary | ICD-10-CM | POA: Diagnosis not present

## 2014-06-04 ENCOUNTER — Encounter (HOSPITAL_COMMUNITY)
Admission: RE | Admit: 2014-06-04 | Discharge: 2014-06-04 | Disposition: A | Discharge status: Home / Self Care | Payer: Medicare Other | Source: Ambulatory Visit | Attending: Cardiology | Admitting: Cardiology

## 2014-06-04 DIAGNOSIS — Z5189 Encounter for other specified aftercare: Secondary | ICD-10-CM | POA: Diagnosis not present

## 2014-06-06 ENCOUNTER — Encounter (HOSPITAL_COMMUNITY)
Admission: RE | Admit: 2014-06-06 | Discharge: 2014-06-06 | Disposition: A | Discharge status: Home / Self Care | Payer: Medicare Other | Source: Ambulatory Visit | Attending: Cardiology | Admitting: Cardiology

## 2014-06-06 DIAGNOSIS — Z5189 Encounter for other specified aftercare: Secondary | ICD-10-CM | POA: Diagnosis not present

## 2014-06-11 ENCOUNTER — Encounter (HOSPITAL_COMMUNITY)
Admission: RE | Admit: 2014-06-11 | Discharge: 2014-06-11 | Disposition: A | Discharge status: Home / Self Care | Payer: Medicare Other | Source: Ambulatory Visit | Attending: Cardiology | Admitting: Cardiology

## 2014-06-11 DIAGNOSIS — I10 Essential (primary) hypertension: Secondary | ICD-10-CM | POA: Insufficient documentation

## 2014-06-11 DIAGNOSIS — I2510 Atherosclerotic heart disease of native coronary artery without angina pectoris: Secondary | ICD-10-CM | POA: Diagnosis not present

## 2014-06-11 DIAGNOSIS — E785 Hyperlipidemia, unspecified: Secondary | ICD-10-CM | POA: Diagnosis not present

## 2014-06-11 DIAGNOSIS — Z8546 Personal history of malignant neoplasm of prostate: Secondary | ICD-10-CM | POA: Diagnosis not present

## 2014-06-11 DIAGNOSIS — K219 Gastro-esophageal reflux disease without esophagitis: Secondary | ICD-10-CM | POA: Diagnosis not present

## 2014-06-11 DIAGNOSIS — Z955 Presence of coronary angioplasty implant and graft: Secondary | ICD-10-CM | POA: Insufficient documentation

## 2014-06-11 DIAGNOSIS — Z923 Personal history of irradiation: Secondary | ICD-10-CM | POA: Insufficient documentation

## 2014-06-11 DIAGNOSIS — Z5189 Encounter for other specified aftercare: Secondary | ICD-10-CM | POA: Insufficient documentation

## 2014-06-13 ENCOUNTER — Encounter (HOSPITAL_COMMUNITY): Payer: Medicare Other

## 2014-06-13 ENCOUNTER — Encounter (HOSPITAL_COMMUNITY)
Admission: RE | Admit: 2014-06-13 | Discharge: 2014-06-13 | Disposition: A | Discharge status: Home / Self Care | Payer: Medicare Other | Source: Ambulatory Visit | Attending: Cardiology | Admitting: Cardiology

## 2014-06-13 DIAGNOSIS — Z5189 Encounter for other specified aftercare: Secondary | ICD-10-CM | POA: Diagnosis not present

## 2014-06-15 ENCOUNTER — Encounter (HOSPITAL_COMMUNITY)
Admission: RE | Admit: 2014-06-15 | Discharge: 2014-06-15 | Disposition: A | Discharge status: Home / Self Care | Payer: Medicare Other | Source: Ambulatory Visit | Attending: Cardiology | Admitting: Cardiology

## 2014-06-15 DIAGNOSIS — Z5189 Encounter for other specified aftercare: Secondary | ICD-10-CM | POA: Diagnosis not present

## 2014-06-18 ENCOUNTER — Encounter (HOSPITAL_COMMUNITY)
Admission: RE | Admit: 2014-06-18 | Discharge: 2014-06-18 | Disposition: A | Discharge status: Home / Self Care | Payer: Medicare Other | Source: Ambulatory Visit | Attending: Cardiology | Admitting: Cardiology

## 2014-06-18 DIAGNOSIS — Z5189 Encounter for other specified aftercare: Secondary | ICD-10-CM | POA: Diagnosis not present

## 2014-06-18 NOTE — Progress Notes (Signed)
During 1115 exercise class pt HR increased to 120's aflutter?.  Pt with complaint of headache and felt his heart was racing "some"  This lasted for about 5 minutes.  Dr. Einar Gip office apprised of rhythm.  Verbalized to pt to restart his cardizem. Pt verbalized understanding. Pt return to NSR bp 124/70. Cherre Huger, BSN

## 2014-06-20 ENCOUNTER — Encounter (HOSPITAL_COMMUNITY)
Admission: RE | Admit: 2014-06-20 | Discharge: 2014-06-20 | Disposition: A | Discharge status: Home / Self Care | Payer: Medicare Other | Source: Ambulatory Visit | Attending: Cardiology | Admitting: Cardiology

## 2014-06-20 DIAGNOSIS — Z5189 Encounter for other specified aftercare: Secondary | ICD-10-CM | POA: Diagnosis not present

## 2014-06-22 ENCOUNTER — Encounter (HOSPITAL_COMMUNITY)
Admission: RE | Admit: 2014-06-22 | Discharge: 2014-06-22 | Disposition: A | Discharge status: Home / Self Care | Payer: Medicare Other | Source: Ambulatory Visit | Attending: Cardiology | Admitting: Cardiology

## 2014-06-22 DIAGNOSIS — Z5189 Encounter for other specified aftercare: Secondary | ICD-10-CM | POA: Diagnosis not present

## 2014-06-25 ENCOUNTER — Encounter (HOSPITAL_COMMUNITY): Payer: Medicare Other

## 2014-06-27 ENCOUNTER — Encounter (HOSPITAL_COMMUNITY)
Admission: RE | Admit: 2014-06-27 | Discharge: 2014-06-27 | Disposition: A | Discharge status: Home / Self Care | Payer: Medicare Other | Source: Ambulatory Visit | Attending: Cardiology | Admitting: Cardiology

## 2014-06-27 DIAGNOSIS — Z5189 Encounter for other specified aftercare: Secondary | ICD-10-CM | POA: Diagnosis not present

## 2014-06-29 ENCOUNTER — Encounter (HOSPITAL_COMMUNITY): Payer: Medicare Other

## 2014-07-02 ENCOUNTER — Encounter (HOSPITAL_COMMUNITY)
Admission: RE | Admit: 2014-07-02 | Discharge: 2014-07-02 | Disposition: A | Discharge status: Home / Self Care | Payer: Medicare Other | Source: Ambulatory Visit | Attending: Cardiology | Admitting: Cardiology

## 2014-07-02 DIAGNOSIS — Z5189 Encounter for other specified aftercare: Secondary | ICD-10-CM | POA: Diagnosis not present

## 2014-07-04 ENCOUNTER — Encounter (HOSPITAL_COMMUNITY)
Admission: RE | Admit: 2014-07-04 | Discharge: 2014-07-04 | Disposition: A | Discharge status: Home / Self Care | Payer: Medicare Other | Source: Ambulatory Visit | Attending: Cardiology | Admitting: Cardiology

## 2014-07-04 DIAGNOSIS — Z5189 Encounter for other specified aftercare: Secondary | ICD-10-CM | POA: Diagnosis not present

## 2014-07-04 NOTE — Progress Notes (Signed)
During the 1115 class pt noted to be back in afib.  Pt asymptomatic rate in 90-110's Pt was not aware that he was back in afib.  Dr. Nadyne Coombes office called relayed information bp 104/60 to [place]idget[place]. Office appt made for him to be seen on tomorrow afternoon at 3:00.  Pt advised that should he become symptomatic sob chest pain, dizziness, or feeling as though he may pass out call 911 immediately.  Pt verbalized understanding and is in agreement of this. Cherre Huger, BSN

## 2014-07-06 ENCOUNTER — Encounter (HOSPITAL_COMMUNITY)
Admission: RE | Admit: 2014-07-06 | Discharge: 2014-07-06 | Disposition: A | Discharge status: Home / Self Care | Payer: Medicare Other | Source: Ambulatory Visit | Attending: Cardiology | Admitting: Cardiology

## 2014-07-06 DIAGNOSIS — Z5189 Encounter for other specified aftercare: Secondary | ICD-10-CM | POA: Diagnosis not present

## 2014-07-09 ENCOUNTER — Encounter (HOSPITAL_COMMUNITY)
Admission: RE | Admit: 2014-07-09 | Discharge: 2014-07-09 | Disposition: A | Discharge status: Home / Self Care | Payer: Medicare Other | Source: Ambulatory Visit | Attending: Cardiology | Admitting: Cardiology

## 2014-07-09 DIAGNOSIS — I10 Essential (primary) hypertension: Secondary | ICD-10-CM | POA: Insufficient documentation

## 2014-07-09 DIAGNOSIS — Z955 Presence of coronary angioplasty implant and graft: Secondary | ICD-10-CM | POA: Insufficient documentation

## 2014-07-09 DIAGNOSIS — K219 Gastro-esophageal reflux disease without esophagitis: Secondary | ICD-10-CM | POA: Diagnosis not present

## 2014-07-09 DIAGNOSIS — Z923 Personal history of irradiation: Secondary | ICD-10-CM | POA: Diagnosis not present

## 2014-07-09 DIAGNOSIS — Z5189 Encounter for other specified aftercare: Secondary | ICD-10-CM | POA: Diagnosis not present

## 2014-07-09 DIAGNOSIS — Z8546 Personal history of malignant neoplasm of prostate: Secondary | ICD-10-CM | POA: Diagnosis not present

## 2014-07-09 DIAGNOSIS — E785 Hyperlipidemia, unspecified: Secondary | ICD-10-CM | POA: Insufficient documentation

## 2014-07-09 DIAGNOSIS — I2510 Atherosclerotic heart disease of native coronary artery without angina pectoris: Secondary | ICD-10-CM | POA: Insufficient documentation

## 2014-07-10 ENCOUNTER — Encounter: Payer: Self-pay | Admitting: Neurology

## 2014-07-11 ENCOUNTER — Encounter (HOSPITAL_COMMUNITY)
Admission: RE | Admit: 2014-07-11 | Discharge: 2014-07-11 | Disposition: A | Discharge status: Home / Self Care | Payer: Medicare Other | Source: Ambulatory Visit | Attending: Cardiology | Admitting: Cardiology

## 2014-07-11 DIAGNOSIS — Z5189 Encounter for other specified aftercare: Secondary | ICD-10-CM | POA: Diagnosis not present

## 2014-07-11 NOTE — Progress Notes (Signed)
Pt graduates today with the completion of 36 exercise sessions in the cardiac rehab phase II program.  Pt maintained excellent attendance to both exercise and education classes. Pt did have some early follow up with MD due to Paroxysmal afib.  Pt made significant progress with increase in his MET level from 3.0 to 4.8. Pt plans to continue his home exercise at the [facility] downtown which he already exercises on the days he is not in rehab.  Pt plans to exercise daily along with his wife.  Based on pt exercising already on his off days, I feel he will be successful in this.  Pt short term goals include building his strength back. Pt feels he has achieved this goal.  He is back to doing activities he did before with ease.  Pt is pleased with his progress.  Long term goal is walk 2 miles a day.  Pt has achieved this goal.  On pts off days from rehab  he walks at least this amount. Psychological Assessment repeat PHQ2 score 0.  Pt demonstrates appropriate coping skills that are both positive and effective. Pt admits he felt nervous when he started CR but his anxiety is much improved and his confidence in he healthy has soared.  Medication list reconciled and pt verbalizes compliance with his medications and voices no barriers for medications compliance.  It was and extreme pleasure to work with this patient in cardiac rehab. Cherre Huger, BSN

## 2014-07-13 ENCOUNTER — Encounter (HOSPITAL_COMMUNITY): Payer: Medicare Other

## 2014-07-16 ENCOUNTER — Encounter (HOSPITAL_COMMUNITY): Payer: Medicare Other

## 2014-07-18 ENCOUNTER — Encounter (HOSPITAL_COMMUNITY): Payer: Medicare Other

## 2014-07-20 ENCOUNTER — Encounter (HOSPITAL_COMMUNITY): Payer: Medicare Other

## 2014-07-23 ENCOUNTER — Encounter (HOSPITAL_COMMUNITY): Payer: Medicare Other

## 2014-07-24 ENCOUNTER — Ambulatory Visit (INDEPENDENT_AMBULATORY_CARE_PROVIDER_SITE_OTHER): Payer: Medicare Other | Admitting: Neurology

## 2014-07-24 ENCOUNTER — Encounter: Payer: Self-pay | Admitting: Neurology

## 2014-07-24 VITALS — BP 112/58 | HR 59 | Resp 18 | Ht 75.0 in | Wt 192.0 lb

## 2014-07-24 DIAGNOSIS — I4891 Unspecified atrial fibrillation: Secondary | ICD-10-CM | POA: Insufficient documentation

## 2014-07-24 DIAGNOSIS — R0902 Hypoxemia: Secondary | ICD-10-CM

## 2014-07-24 DIAGNOSIS — R0683 Snoring: Secondary | ICD-10-CM

## 2014-07-24 NOTE — Progress Notes (Signed)
SLEEP MEDICINE CLINIC   Provider:  Larey Seat, M D  Referring Provider:  Adrian Prows, MD  Primary Care Physician:  Simona Huh, MD  Chief Complaint  Patient presents with  . NP [facility] Sleep Consult    Rm 10, alone    HPI:  Vincent [name] is a 74 y.o. male seen here as a referral from Dr. Einar Gip for a sleep consult,   Mr. Vincent [name], a caucasian, right handed , married male patient  is referred today by Dr. Einar Gip,  his cardiologist. He had recently seen Dr. Einar Gip  treats him for atrial fibrillation. Dr. Amanda Cockayne obtained an overnight pulse oximetry, which and return documented nocturnal hypoxemia. This is a concern for possible sleep apnea being at the origin of the hypoxemia and for this reason a sleep consultation was ordered. The patient's heart rate has been well controlled on Cardizem. He had paroxysmal episodes of atrial fibrillation and takes Xarelto as an anticoagulant. He was actually at cardiac rehabilitation in December 2015 when he developed a rapid ventricular heartbeat. And then was started on amiodarone. It seems that the cardiac exercises have provoked a paroxysmal atrial fibrillation at least twice. The overnight pulse oximetry ordered by his cardiologist was performed on 07-07-14, it documented high pulses at 76 bpm and normal pulses at 44 bpm, the time at or below 88% oxygenation was 297 minutes the time at or below 89% was 370 minutes. This is a very long total part of the sleep time spent in hypoxemia. Since the spells of lowest oxygenation seemed to arise at 3 AM and 5 AM I have wondered if they are related to REM sleep which usually occurs at that time.  In October 2015 the patient underwent a percutaneous transluminal coronary angioplasty with Dr.  Darnell Level . He also has hyperlipidemia but he is not diabetic he's not obese and he doesn't have other known risk factors for sleep apnea. 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 He underwent a sinus surgery  in 1999.   Sleep habits are as follows Mr. Vincent [name] used to go to bed at around 10:30 and is promptly asleep. He wakes up twice to go to the bathroom but has no trouble falling back asleep, except perhaps once a week , when he has trouble to re-initiate sleep. He usually rises at 7 AM, no alarm needed.  His wife noted mild snoring, not sure about apnea. There is an observed positional component, only noted in supine.  On average he would get about 8 hours of nocturnal sleep. He shares his bedroom with his wife in the bedroom is described as cool, quiet and dark. He uses light blocking shades and he has no TV or radio music etc. interrupting his sleep or affecting his sleep. He uses a humidifier. He has not gotten sleepy while driving, he alternates with his wife on long trips.  He does nap in daytime : 30 minute timed sleep time, after lunch.    Review of Systems: Out of a complete 14 system review, the patient complains of only the following symptoms, and all other reviewed systems are negative. Mild snoring, palpitations and throat palpitations. , no SOB, no diziness. No chest pain.   Epworth score 4 , Fatigue severity score 20  , depression score 2  He told me about his student days in  [place], where I spent my pre medicine studies.     History  Social History  . Marital Status: Married    Spouse Name: [name] (goes by [name]  . Number of Children: 2  . Years of Education: Masters   Occupational History  . retired    Social History Main Topics  . Smoking status: Former Research scientist (life sciences)  . Smokeless tobacco: Never Used  . Alcohol Use: 6.0 oz/week    10 Glasses of wine per week  . Drug Use: No  . Sexual Activity: Not on file   Other Topics Concern  . Not on file   Social History Narrative   Caffeine- none.      Family History  Problem Relation Age of Onset  . Heart disease Brother   . Diabetes Brother   . Diabetes Mother     Past Medical History  Diagnosis Date    . Allergy     seasonal  . Cancer     basal cell/squamouscell  . GERD (gastroesophageal reflux disease)   . Hyperlipidemia   . Hypertension   . Prostate cancer Nov.-Dec.2000    radiation  . Dysrhythmia     A FIB    Past Surgical History  Procedure Laterality Date  . Prostate surgery  01/1999  . Insertion prostate radiation seed  06/1999    TUNA, 1999  . Nasal septum surgery  12/1997    also polyps removed  . Left heart catheterization with coronary angiogram N/A 03/16/2014    Procedure: LEFT HEART CATHETERIZATION WITH CORONARY ANGIOGRAM;  Surgeon: Laverda Page, MD;  Location: [facility] CATH LAB;  Service: Cardiovascular;  Laterality: N/A;  . Transurethral needle ablation of the prostate  2000    TUNA    Current Outpatient Prescriptions  Medication Sig Dispense Refill  . amiodarone (PACERONE) 200 MG tablet Take 200 mg by mouth daily.     . bethanechol (URECHOLINE) 25 MG tablet Take 25 mg by mouth 2 (two) times daily.     . bisoprolol-hydrochlorothiazide (ZIAC) 5-6.25 MG per tablet Take 0.5 tablets by mouth daily.     . calcium carbonate (OS-CAL) 600 MG TABS Take 600 mg by mouth 2 (two) times daily with a meal.     . Cholecalciferol (VITAMIN D) 2000 UNITS tablet Take 2,000 Units by mouth daily.     . clopidogrel (PLAVIX) 75 MG tablet Take 75 mg by mouth daily.    Marland Kitchen diltiazem (CARDIZEM) 120 MG tablet Take 120 mg by mouth 2 (two) times daily.     Marland Kitchen doxazosin (CARDURA) 4 MG tablet Take 4 mg by mouth at bedtime.      . fluticasone (FLONASE) 50 MCG/ACT nasal spray Place 1 spray into both nostrils daily.    . Misc Natural Products (PUMPKIN SEED OIL) CAPS Take 1 capsule by mouth daily.      . Multiple Vitamins-Minerals (MULTIVITAMIN WITH MINERALS) tablet Take 1 tablet by mouth daily.      Marland Kitchen NITROSTAT 0.4 MG SL tablet Place 0.4 mg under the tongue every 5 (five) minutes as needed for chest pain.     Marland Kitchen omeprazole (PRILOSEC) 20 MG capsule Take 1 tablet by mouth daily.     . pravastatin  (PRAVACHOL) 80 MG tablet Take 80 mg by mouth daily.    . psyllium (REGULOID) 0.52 G capsule Take 0.52 g by mouth daily.      . rivaroxaban (XARELTO) 20 MG TABS tablet Take 20 mg by mouth daily.    . vitamin C (ASCORBIC ACID) 500 MG tablet Take 500 mg by mouth daily.      Marland Kitchen  vitamin E 400 UNIT capsule Take 400 Units by mouth daily.       No current facility-administered medications for this visit.    Allergies as of 07/24/2014  . (No Known Allergies)    Vitals: BP 112/58 mmHg  Pulse 59  Resp 18  Ht 6\' 3"  (1.905 m)  Wt 192 lb (87.091 kg)  BMI 24.00 kg/m2 Last Weight:  Wt Readings from Last 1 Encounters:  07/24/14 192 lb (87.091 kg)       Last Height:   Ht Readings from Last 1 Encounters:  07/24/14 6\' 3"  (1.905 m)    Physical exam:  General: The patient is awake, alert and appears not in acute distress. The patient is well groomed. Head: Normocephalic, atraumatic. Neck is supple. Mallampati 2 ,  neck circumference: 15. Nasal airflow unrestricted , TMJ is  evident . Retrognathia is seen.  Cardiovascular:  Regular rate and rhythm today , without  murmurs or carotid bruit, and without distended neck veins. Respiratory: Lungs are clear to auscultation. Skin:  Without evidence of edema, or rash Trunk: BMI is normal.  Neurologic exam : The patient is awake and alert, oriented to place and time.   Memory subjective  described as intact. There is a normal attention span & concentration ability. Speech is fluent without  dysarthria, dysphonia or aphasia. Mood and affect are appropriate.  Cranial nerves: Pupils are equal and briskly reactive to light. Funduscopic exam without evidence of pallor or edema.  Extraocular movements  in vertical and horizontal planes intact and without nystagmus. Visual fields by finger perimetry are intact. Hearing to finger rub intact.  Facial sensation intact to fine touch. Facial motor strength is symmetric and tongue and uvula move midline.  Motor  exam: Normal tone ,muscle bulk and symmetric ,strength in all extremities.  Sensory:  Fine touch, pinprick and vibration were tested in all extremities. Proprioception is normal.  Coordination: Rapid alternating movements in the fingers/hands is normal.  Finger-to-nose maneuver  normal without evidence of ataxia, dysmetria or tremor.  Gait and station: Patient walks without assistive device and is able unassisted to climb up to the exam table.  Strength within normal limits. Stance is stable and normal. Tandem gait is unfragmented. Romberg testing is negative.  Deep tendon reflexes: in the  upper and lower extremities are symmetric and intact.  Babinski maneuver response is downgoing.   Assessment:  After physical and neurologic examination, review of laboratory studies, imaging, neurophysiology testing and pre-existing records, assessment is   OSA moderate risk.  RLS to be evaluated.  1) patient will undergo a PSG , with SPLIT as needed at AHI 20 and score at 4 %. 2) supine sleep needed.  3) patient has noted leg discomfort, and placed a pillow between his knees. He is not kicking.     The patient was advised of the nature of the diagnosed sleep disorder , the treatment options and risks for general a health and wellness arising from not treating the condition. Visit duration was 30 minutes.   Plan:  Treatment plan and additional workup : see above       Larey Seat MD  07/24/2014

## 2014-07-24 NOTE — Patient Instructions (Signed)
Polysomnography (Sleep Studies) Polysomnography (PSG) is a series of tests used for detecting (diagnosing) obstructive sleep apnea and other sleep disorders. The tests measure how some parts of your body are working while you are sleeping. The tests are extensive and expensive. They are done in a sleep lab or hospital, and vary from center to center. Your caregiver may perform other more simple sleep studies and questionnaires before doing more complete and involved testing. Testing may not be covered by insurance. Some of these tests are:  An EEG (Electroencephalogram). This tests your brain waves and stages of sleep.  An EOG (Electrooculogram). This measures the movements of your eyes. It detects periods of REM (rapid eye movement) sleep, which is your dream sleep.  An EKG (Electrocardiogram). This measures your heart rhythm.  EMG (Electromyography). This is a measurement of how the muscles are working in your upper airway and your legs while sleeping.  An oximetry measurement. It measures how much oxygen (air) you are getting while sleeping.  Breathing efforts may be measured. The same test can be interpreted (understood) differently by different caregivers and centers that study sleep.  Studies may be given an apnea/hypopnea index (AHI). This is a number which is found by counting the times of no breathing or under breathing during the night, and relating those numbers to the amount of time spent in bed. When the AHI is greater than 15, the patient is likely to complain of daytime sleepiness. When the AHI is greater than 30, the patient is at increased risk for heart problems and must be followed more closely. Following the AHI also allows you to know how treatment is working. Simple oximetry (tracking the amount of oxygen that is taken in) can be used for screening patients who:  Do not have symptoms (problems) of OSA.  Have a normal Epworth Sleepiness Scale Score.  Have a low pre-test  probability of having OSA.  Have none of the upper airway problems likely to cause apnea.  Oximetry is also used to determine if treatment is effective in patients who showed significant desaturations (not getting enough oxygen) on their home sleep study. One extra measure of safety is to perform additional studies for the person who only snores. This is because no one can predict with absolute certainty who will have OSA. Those who show significant desaturations (not getting enough oxygen) are recommended to have a more detailed sleep study. Document Released: 11/29/2002 Document Revised: 08/17/2011 Document Reviewed: 07/31/2013 Exit[employer] Patient Information 2015 [employer]. This information is not intended to replace advice given to you by your health care provider. Make sure you discuss any questions you have with your health care provider.  

## 2014-07-25 ENCOUNTER — Encounter (HOSPITAL_COMMUNITY): Payer: Medicare Other

## 2014-07-27 ENCOUNTER — Ambulatory Visit (INDEPENDENT_AMBULATORY_CARE_PROVIDER_SITE_OTHER): Payer: Medicare Other | Admitting: Neurology

## 2014-07-27 ENCOUNTER — Encounter (HOSPITAL_COMMUNITY): Payer: Medicare Other

## 2014-07-27 VITALS — BP 144/72 | HR 52 | Ht 75.0 in | Wt 192.0 lb

## 2014-07-27 DIAGNOSIS — R0902 Hypoxemia: Secondary | ICD-10-CM

## 2014-07-27 DIAGNOSIS — G4730 Sleep apnea, unspecified: Secondary | ICD-10-CM

## 2014-07-27 DIAGNOSIS — I4891 Unspecified atrial fibrillation: Secondary | ICD-10-CM

## 2014-07-27 DIAGNOSIS — R0683 Snoring: Secondary | ICD-10-CM

## 2014-07-28 NOTE — Sleep Study (Signed)
See attached document in Encounters tab 

## 2014-07-30 ENCOUNTER — Encounter (HOSPITAL_COMMUNITY): Payer: Medicare Other

## 2014-08-01 ENCOUNTER — Encounter (HOSPITAL_COMMUNITY): Payer: Medicare Other

## 2014-08-03 ENCOUNTER — Encounter (HOSPITAL_COMMUNITY): Payer: Medicare Other

## 2014-08-07 ENCOUNTER — Encounter: Payer: Self-pay | Admitting: Neurology

## 2014-08-08 ENCOUNTER — Telehealth: Payer: Self-pay | Admitting: *Deleted

## 2014-08-08 NOTE — Telephone Encounter (Signed)
Vincent [name] was contacted and provided and overview of his sleep study results.  Patient was informed that Dr. Brett Fairy had requested a follow up appointment to go over the details of his study and consideration of treatment options.  Patient was in agreement and scheduled his follow up appointment for 08/20/2014 at 11:15 am.  Dr. Einar Gip was faxed a copy of the report.

## 2014-08-13 DIAGNOSIS — G4730 Sleep apnea, unspecified: Secondary | ICD-10-CM | POA: Insufficient documentation

## 2014-08-20 ENCOUNTER — Ambulatory Visit (INDEPENDENT_AMBULATORY_CARE_PROVIDER_SITE_OTHER): Payer: Medicare Other | Admitting: Neurology

## 2014-08-20 ENCOUNTER — Encounter: Payer: Self-pay | Admitting: Neurology

## 2014-08-20 VITALS — BP 112/59 | HR 50 | Resp 14 | Ht 74.5 in | Wt 194.2 lb

## 2014-08-20 DIAGNOSIS — G4733 Obstructive sleep apnea (adult) (pediatric): Secondary | ICD-10-CM | POA: Insufficient documentation

## 2014-08-20 DIAGNOSIS — G478 Other sleep disorders: Secondary | ICD-10-CM | POA: Insufficient documentation

## 2014-08-20 NOTE — Progress Notes (Signed)
SLEEP MED[facility]INE CLINIC   Provider:  Larey Seat, M D  Referring Provider:  Adrian Prows, MD  Primary Care Physician:  Simona Huh, MD  Chief Complaint  Patient presents with  . RV Sleep    Rm 10, alone    HPI:  Vincent [name] is a 74 y.o. male seen here as a referral from Dr. Einar Gip for a sleep consult,   Vincent [name], a caucasian, right handed , married male patient  is referred today by Dr. Einar Gip,  his cardiologist. He had recently seen Dr. Einar Gip  treats him for atrial fibrillation. Dr. Amanda Cockayne obtained an overnight pulse oximetry, which and return documented nocturnal hypoxemia. This is a concern for possible sleep apnea being at the origin of the hypoxemia and for this reason a sleep consultation was ordered. The patient's heart rate has been well controlled on Cardizem. He had paroxysmal episodes of atrial fibrillation and takes Xarelto as an anticoagulant. He was actually at cardiac rehabilitation in December 2015 when he developed a rapid ventricular heartbeat. And then was started on amiodarone. It seems that the cardiac exercises have provoked a paroxysmal atrial fibrillation at least twice. The overnight pulse oximetry ordered by his cardiologist was performed on 07-07-14, it documented high pulses at 76 bpm and normal pulses at 44 bpm, the time at or below 88% oxygenation was 297 minutes the time at or below 89% was 370 minutes. This is a very long total part of the sleep time spent in hypoxemia. Since the spells of lowest oxygenation seemed to arise at 3 AM and 5 AM I have wondered if they are related to REM sleep which usually occurs at that time. In October 2015 the patient underwent a percutaneous transluminal coronary angioplasty with Dr.Ganji .  He also has hyperlipidemia but he is not diabetic he's not obese and he doesn't have other known risk factors for sleep apnea. 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 He underwent a sinus surgery in 1999.    Sleep habits are as follows Vincent [name] used to go to bed at around 10:30 and is promptly asleep. He wakes up twice to go to the bathroom but has no trouble falling back asleep, except perhaps once a week , when he has trouble to re-initiate sleep. He usually rises at 7 AM, no alarm needed.  His wife noted mild snoring, not sure about apnea. There is an observed positional component, only noted in supine.  On average he would get about 8 hours of nocturnal sleep. He shares his bedroom with his wife in the bedroom is described as cool, quiet and dark. He uses light blocking shades and he has no TV or radio music etc. interrupting his sleep or affecting his sleep. He uses a humidifier. He has not gotten sleepy while driving, he alternates with his wife on long trips.  He does nap in daytime : 30 minute timed sleep time, after lunch.    Interval history, 08-20-14  Dear Dr. Einar Gip,   our mutual patient Vincent [name] underwent and attended polysomnography. The patient had an apnea hypopnea index  AHI of 5.0, and an RDI of 13.4. There was a strong supine position component to this otherwise very mild apnea. And REM sleep did not affect his apnea at all. His lowest oxygen level was 85% but only with 8.7 minutes throughout the night not significant enough to call his hypoxemia. Interesting the patient had 0 periodic  limb movements and I will relate to elaborate on his specific leg pain. My goal for this patient would be to not use CPAP but to actually encourage him to sleep on his side as the positional apnea seems to be the greatest. In addition I would encourage him to ask his dentist if he could be a candidate for a mandibular advancement therapy. In this particular dental device the upper jaw anchors the device structure and is used to push the lower jaw forward- thereby creating space and the back of the throat. His son is an Chief Financial Officer in [place], [place].  We did not see atrial fibrillation during this  study. This will treat mild apnea but especially upper airway resistance he and snoring.  As to his leg pain Vincent [name] reports that it is his left lateral thigh that hurts him and I believe that this is the lateral femoral nerve or a radiating hip pain.  He denies any lower back pain or specific hip related click.   Review of Systems: Out of a complete 14 system review, the patient complains of only the following symptoms, and all other reviewed systems are negative. Mild snoring, palpitations and throat palpitations. , no SOB, no diziness. No chest pain.   Epworth score 3 , Fatigue severity score 14 , depression score 1  PS : He told me about his student days in 1959 [place], where I spent my pre medicine studies.     History   Social History  . Marital Status: Married    Spouse Name: [name] (goes by [name]  . Number of Children: 2  . Years of Education: Masters   Occupational History  . retired    Social History Main Topics  . Smoking status: Former Research scientist (life sciences)  . Smokeless tobacco: Never Used  . Alcohol Use: 6.0 oz/week    10 Glasses of wine per week  . Drug Use: No  . Sexual Activity: Not on file   Other Topics Concern  . Not on file   Social History Narrative   Caffeine- none.      Family History  Problem Relation Age of Onset  . Heart disease Brother   . Diabetes Brother   . Diabetes Mother     Past Medical History  Diagnosis Date  . Allergy     seasonal  . Cancer     basal cell/squamouscell  . GERD (gastroesophageal reflux disease)   . Hyperlipidemia   . Hypertension   . Prostate cancer Nov.-Dec.2000    radiation  . Dysrhythmia     A FIB    Past Surgical History  Procedure Laterality Date  . Prostate surgery  01/1999  . Insertion prostate radiation seed  06/1999    TUNA, 1999  . Nasal septum surgery  12/1997    also polyps removed  . Left heart catheterization with coronary angiogram N/A 03/16/2014    Procedure: LEFT HEART CATHETERIZATION  WITH CORONARY ANGIOGRAM;  Surgeon: Laverda Page, MD;  Location: [facility] CATH LAB;  Service: Cardiovascular;  Laterality: N/A;  . Transurethral needle ablation of the prostate  2000    TUNA    Current Outpatient Prescriptions  Medication Sig Dispense Refill  . amiodarone (PACERONE) 200 MG tablet Take 200 mg by mouth daily.     . bethanechol (URECHOLINE) 25 MG tablet Take 25 mg by mouth 2 (two) times daily.     . bisoprolol-hydrochlorothiazide (ZIAC) 5-6.25 MG per tablet Take 0.5 tablets by mouth daily.     Marland Kitchen  calcium carbonate (OS-CAL) 600 MG TABS Take 600 mg by mouth 2 (two) times daily with a meal.     . Cholecalciferol (VITAMIN D) 2000 UNITS tablet Take 2,000 Units by mouth daily.     . clopidogrel (PLAVIX) 75 MG tablet Take 75 mg by mouth daily.    Marland Kitchen diltiazem (CARDIZEM) 120 MG tablet Take 120 mg by mouth 2 (two) times daily.     Marland Kitchen doxazosin (CARDURA) 4 MG tablet Take 4 mg by mouth at bedtime.      . fluticasone (FLONASE) 50 MCG/ACT nasal spray Place 1 spray into both nostrils daily.    . Misc Natural Products (PUMPKIN SEED OIL) CAPS Take 1 capsule by mouth daily.      . Multiple Vitamins-Minerals (MULTIVITAMIN WITH MINERALS) tablet Take 1 tablet by mouth daily.      Marland Kitchen NITROSTAT 0.4 MG SL tablet Place 0.4 mg under the tongue every 5 (five) minutes as needed for chest pain.     Marland Kitchen omeprazole (PRILOSEC) 20 MG capsule Take 1 tablet by mouth daily.     . pravastatin (PRAVACHOL) 80 MG tablet Take 80 mg by mouth daily.    . psyllium (REGULOID) 0.52 G capsule Take 0.52 g by mouth daily.      . rivaroxaban (XARELTO) 20 MG TABS tablet Take 20 mg by mouth daily.    . vitamin C (ASCORBIC ACID) 500 MG tablet Take 500 mg by mouth daily.      . vitamin E 400 UNIT capsule Take 400 Units by mouth daily.       No current facility-administered medications for this visit.    Allergies as of 08/20/2014  . (No Known Allergies)    Vitals: BP 112/59 mmHg  Pulse 50  Resp 14  Ht 6' 2.5" (1.892 m)  Wt  194 lb 3.2 oz (88.089 kg)  BMI 24.61 kg/m2 Last Weight:  Wt Readings from Last 1 Encounters:  08/20/14 194 lb 3.2 oz (88.089 kg)       Last Height:   Ht Readings from Last 1 Encounters:  08/20/14 6' 2.5" (1.892 m)    Physical exam:  General: The patient is awake, alert and appears not in acute distress. The patient is well groomed. Head: Normocephalic, atraumatic. Neck is supple. Mallampati 2 ,  neck circumference: 15. Nasal airflow unrestricted , TMJ is  evident . Retrognathia is seen.  Cardiovascular:  Regular rate and rhythm today , without  murmurs or carotid bruit, and without distended neck veins. Respiratory: Lungs are clear to auscultation. Skin:  Without evidence of edema, or rash Trunk: BMI is normal.  Neurologic exam : The patient is awake and alert, oriented to place and time.   Memory subjective  described as intact. There is a normal attention span & concentration ability. Speech is fluent. Mood and affect are appropriate. Visual fields by finger perimetry are intact. Hearing to finger rub intact.  Facial sensation intact to fine touch. Facial motor strength is symmetric and tongue and uvula move midline. Motor exam: Normal tone ,muscle bulk and symmetric ,strength in all extremities. Sensory:  Fine touch, pinprick and vibration were tested in all extremities. Proprioception is normal. Gait and station: Patient walks without assistive device and is able unassisted to climb up to the exam table.  Strength within normal limits. Stance is stable and normal. Tandem gait is unfragmented. Romberg testing is negative. Deep tendon reflexes: in the  upper and lower extremities are symmetric and intact.  Babinski maneuver response is  downgoing.   Assessment:  After physical and neurologic examination, review of laboratory studies, imaging, neurophysiology testing and pre-existing records, assessment is  UARS and positional sleep apnea. The patient was advised of the nature of  the diagnosed sleep disorder , the treatment options and risks for general a health and wellness arising from not treating the condition. Visit duration was 39minutes.   Plan:  Treatment plan and additional workup : dental referral , he will see his son for advice.             Positional treatment, Tennisball methode explained.    Asencion Partridge Kaytlin Burklow MD  08/20/2014

## 2014-10-19 ENCOUNTER — Encounter: Payer: Self-pay | Admitting: Gastroenterology

## 2014-12-03 ENCOUNTER — Other Ambulatory Visit: Payer: Self-pay

## 2015-03-25 ENCOUNTER — Ambulatory Visit
Admission: RE | Admit: 2015-03-25 | Discharge: 2015-03-25 | Disposition: A | Discharge status: Home / Self Care | Payer: Medicare Other | Source: Ambulatory Visit | Attending: Cardiology | Admitting: Cardiology

## 2015-03-25 ENCOUNTER — Other Ambulatory Visit: Payer: Self-pay | Admitting: Cardiology

## 2015-03-25 DIAGNOSIS — Z5181 Encounter for therapeutic drug level monitoring: Secondary | ICD-10-CM

## 2015-03-27 ENCOUNTER — Other Ambulatory Visit (HOSPITAL_COMMUNITY): Payer: Self-pay | Admitting: Respiratory Therapy

## 2015-03-27 DIAGNOSIS — Z5181 Encounter for therapeutic drug level monitoring: Secondary | ICD-10-CM

## 2015-04-01 ENCOUNTER — Ambulatory Visit (HOSPITAL_COMMUNITY)
Admission: RE | Admit: 2015-04-01 | Discharge: 2015-04-01 | Disposition: A | Discharge status: Home / Self Care | Payer: Medicare Other | Source: Ambulatory Visit | Attending: Cardiology | Admitting: Cardiology

## 2015-04-01 DIAGNOSIS — Z79899 Other long term (current) drug therapy: Secondary | ICD-10-CM | POA: Insufficient documentation

## 2015-04-01 DIAGNOSIS — Z5181 Encounter for therapeutic drug level monitoring: Secondary | ICD-10-CM | POA: Insufficient documentation

## 2015-04-01 LAB — PULMONARY FUNCTION TEST
DL/VA % pred: 52 %
DL/VA: 2.55 ml/min/mmHg/L
DLCO cor % pred: 47 %
DLCO cor: 18.44 ml/min/mmHg
DLCO unc % pred: 43 %
DLCO unc: 17.06 ml/min/mmHg
FEF 25-75 Post: 2.12 L/sec
FEF 25-75 Pre: 1.36 L/sec
FEF2575-%Change-Post: 55 %
FEF2575-%Pred-Post: 80 %
FEF2575-%Pred-Pre: 51 %
FEV1-%CHANGE-POST: 8 %
FEV1-%PRED-PRE: 90 %
FEV1-%Pred-Post: 97 %
FEV1-POST: 3.57 L
FEV1-Pre: 3.3 L
FEV1FVC-%Change-Post: 5 %
FEV1FVC-%Pred-Pre: 81 %
FEV6-%Change-Post: 4 %
FEV6-%PRED-POST: 113 %
FEV6-%PRED-PRE: 107 %
FEV6-Post: 5.38 L
FEV6-Pre: 5.13 L
FEV6FVC-%Change-Post: 3 %
FEV6FVC-%PRED-POST: 99 %
FEV6FVC-%Pred-Pre: 96 %
FVC-%CHANGE-POST: 2 %
FVC-%PRED-PRE: 112 %
FVC-%Pred-Post: 115 %
FVC-POST: 5.81 L
FVC-PRE: 5.66 L
PRE FEV6/FVC RATIO: 91 %
Post FEV1/FVC ratio: 61 %
Post FEV6/FVC ratio: 94 %
Pre FEV1/FVC ratio: 58 %
RV % pred: 69 %
RV: 1.98 L
TLC % PRED: 97 %
TLC: 7.9 L

## 2015-04-01 MED ORDER — ALBUTEROL SULFATE (2.5 MG/3ML) 0.083% IN NEBU
2.5000 mg | INHALATION_SOLUTION | Freq: Once | RESPIRATORY_TRACT | Status: AC
  Administered 2015-04-01: 2.5 mg via RESPIRATORY_TRACT

## 2015-05-09 ENCOUNTER — Encounter: Payer: Self-pay | Admitting: *Deleted

## 2015-05-09 ENCOUNTER — Ambulatory Visit (INDEPENDENT_AMBULATORY_CARE_PROVIDER_SITE_OTHER): Payer: Medicare Other | Admitting: Emergency Medicine

## 2015-05-09 VITALS — BP 120/70 | HR 59 | Ht 75.0 in | Wt 200.0 lb

## 2015-05-09 DIAGNOSIS — R938 Abnormal findings on diagnostic imaging of other specified body structures: Secondary | ICD-10-CM | POA: Diagnosis not present

## 2015-05-09 DIAGNOSIS — Z9189 Other specified personal risk factors, not elsewhere classified: Secondary | ICD-10-CM

## 2015-05-09 DIAGNOSIS — J849 Interstitial pulmonary disease, unspecified: Secondary | ICD-10-CM | POA: Diagnosis not present

## 2015-05-09 DIAGNOSIS — R942 Abnormal results of pulmonary function studies: Secondary | ICD-10-CM

## 2015-05-09 DIAGNOSIS — J449 Chronic obstructive pulmonary disease, unspecified: Secondary | ICD-10-CM

## 2015-05-09 DIAGNOSIS — Z79899 Other long term (current) drug therapy: Secondary | ICD-10-CM | POA: Insufficient documentation

## 2015-05-09 DIAGNOSIS — R9389 Abnormal findings on diagnostic imaging of other specified body structures: Secondary | ICD-10-CM

## 2015-05-09 NOTE — Assessment & Plan Note (Signed)
Obstruction clearly evident on his spirometry although he does not have any history of asthma, does not have any asthmatic symptoms. Not clear to me that he needs any therapy at this time but it is an important observation. I will defer bronchodilators at this time

## 2015-05-09 NOTE — Assessment & Plan Note (Signed)
Note decreased diffusion capacity in a patient with a history of amiodarone exposure. We will assess him with a chest CT and  Ambulatory oximetry

## 2015-05-09 NOTE — Progress Notes (Signed)
Subjective:    Patient ID: Vincent [name], male    DOB: Oct 21, 1940, 74 y.o.   MRN: [id number]  HPI 74 year old man with a history of CAD, atrial fibrillation, has been on amiodarone for about a year.  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 He underwent a chest x-ray and pulmonary function testing is standard screening for potential amiodarone induced lung toxicity. PFT performed on 04/02/15 were reviewed today. This showed mild obstruction with no significant bronchodilator response. Decreased residual volume consistent with possible superimposed restriction, and a decreased diffusion capacity. His chest x-ray from 03/25/15 shows slight increase in his pulmonary interstitial markings particularly at the bilateral lung bases. He denies any problems with his breathing. He is still able to sing in the choir. He does not perceive cough or wheeze. He does have some nasal allergies. Of note he mentions Radon exposure in his home   Review of Systems  HENT: Positive for sneezing.   Gastrointestinal:       GERD    Past Medical History  Diagnosis Date  . Allergy     seasonal  . Cancer ([place])     basal cell/squamouscell  . GERD (gastroesophageal reflux disease)   . Hyperlipidemia   . Hypertension   . Prostate cancer ([place]) Nov.-Dec.2000    radiation  . Dysrhythmia     A FIB  . OSA (obstructive sleep apnea)      Family History  Problem Relation Age of Onset  . Heart disease Brother   . Diabetes Brother   . Diabetes Mother      Social History   Social History  . Marital Status: Married    Spouse Name: [name] (goes by [name]  . Number of Children: 2  . Years of Education: Masters   Occupational History  . retired    Social History Main Topics  . Smoking status: Former Smoker -- 0.50 packs/day for 8 years    Types: Cigarettes    Quit date: 06/08/1965  . Smokeless  tobacco: Never Used  . Alcohol Use: 6.0 oz/week    10 Glasses of wine per week  . Drug Use: No  . Sexual Activity: Not on file   Other Topics Concern  . Not on file   Social History Narrative   Caffeine- none.       No Known Allergies   Outpatient Prescriptions Prior to Visit  Medication Sig Dispense Refill  . amiodarone (PACERONE) 200 MG tablet Take 200 mg by mouth daily.     . bethanechol (URECHOLINE) 25 MG tablet Take 25 mg by mouth 2 (two) times daily.     . bisoprolol-hydrochlorothiazide (ZIAC) 5-6.25 MG per tablet Take 0.5 tablets by mouth daily.     . calcium carbonate (OS-CAL) 600 MG TABS Take 600 mg by mouth 2 (two) times daily with a meal.     . Cholecalciferol (VITAMIN D) 2000 UNITS tablet Take 2,000 Units by mouth daily.     Marland Kitchen diltiazem (CARDIZEM) 120 MG tablet Take 120 mg by mouth 2 (two) times daily.     Marland Kitchen doxazosin (CARDURA) 4 MG tablet Take 4 mg by mouth at bedtime.      . fluticasone (FLONASE) 50 MCG/ACT nasal spray Place 1 spray into both nostrils daily.    . Misc Natural Products (PUMPKIN SEED OIL) CAPS Take 1 capsule by mouth daily.      Marland Kitchen  Multiple Vitamins-Minerals (MULTIVITAMIN WITH MINERALS) tablet Take 1 tablet by mouth daily.      Marland Kitchen NITROSTAT 0.4 MG SL tablet Place 0.4 mg under the tongue every 5 (five) minutes as needed for chest pain.     Marland Kitchen omeprazole (PRILOSEC) 20 MG capsule Take 1 tablet by mouth daily.     . pravastatin (PRAVACHOL) 80 MG tablet Take 80 mg by mouth daily.    . psyllium (REGULOID) 0.52 G capsule Take 0.52 g by mouth daily.      . rivaroxaban (XARELTO) 20 MG TABS tablet Take 20 mg by mouth daily.    . vitamin C (ASCORBIC ACID) 500 MG tablet Take 500 mg by mouth daily.      . vitamin E 400 UNIT capsule Take 400 Units by mouth daily.      . clopidogrel (PLAVIX) 75 MG tablet Take 75 mg by mouth daily.     No facility-administered medications prior to visit.         Objective:   Physical Exam Filed Vitals:   05/09/15 1531 05/09/15  1532  BP:  120/70  Pulse:  59  Height: 6\' 3"  (1.905 m)   Weight: 200 lb (90.719 kg)   SpO2:  95%   Gen: Pleasant, well-nourished, in no distress,  normal affect  ENT: No lesions,  mouth clear,  oropharynx clear, no postnasal drip  Neck: No JVD, no TMG, no carotid bruits  Lungs: No use of accessory muscles, clear without rales or rhonchi  Cardiovascular: RRR, heart sounds normal, no murmur or gallops, no peripheral edema  Musculoskeletal: No deformities, no cyanosis or clubbing  Neuro: alert, non focal  Skin: Warm, no lesions or rashes    03/25/15 --  COMPARISON: 12/08/2012. 05/22/2011. 05/16/2009.  FINDINGS: Mediastinum and hilar structures are normal. Slight increase in pulmonary interstitial markings are noted, particularly in the lung bases. Although these changes may be related to technique, interstitial pneumonitis possibly related to amiodarone therapy cannot be excluded. No pleural effusion. No pneumothorax. Heart size and pulmonary vascularity normal. Mild degenerative changes thoracic spine.  IMPRESSION: Mild increase in pulmonary interstitial markings are noted, particularly in the lung bases. Although these changes may be related to technique, interstitial pneumonitis, possibly related to amiodarone therapy cannot be excluded     Assessment & Plan:  Abnormal finding on pulmonary function testing Note decreased diffusion capacity in a patient with a history of amiodarone exposure. We will assess him with a chest CT and  Ambulatory oximetry  Abnormal finding on chest xray Mild interstitial prominence seen in combination with abnormal diffusion on function testing and his amiodarone use, radon exposure,  I believe we need to check a high-resolution CT scan of the chest to evaluate for interstitial lung disease  Obstructive lung disease (generalized) ([place]) Obstruction clearly evident on his spirometry although he does not have any history of asthma,  does not have any asthmatic symptoms. Not clear to me that he needs any therapy at this time but it is an important observation. I will defer bronchodilators at this time

## 2015-05-09 NOTE — Patient Instructions (Signed)
We will perform ambulatory oximetry today.  We will perform a high resolution CT scan of your chest, no contrast.  Follow with Dr Lamonte Sakai next available to review the results.

## 2015-05-09 NOTE — Assessment & Plan Note (Signed)
Mild interstitial prominence seen in combination with abnormal diffusion on function testing and his amiodarone use, radon exposure,  I believe we need to check a high-resolution CT scan of the chest to evaluate for interstitial lung disease

## 2015-05-14 ENCOUNTER — Ambulatory Visit
Admission: RE | Admit: 2015-05-14 | Discharge: 2015-05-14 | Disposition: A | Discharge status: Home / Self Care | Payer: Medicare Other | Source: Ambulatory Visit | Attending: Emergency Medicine | Admitting: Emergency Medicine

## 2015-05-14 DIAGNOSIS — J849 Interstitial pulmonary disease, unspecified: Secondary | ICD-10-CM

## 2015-05-16 ENCOUNTER — Other Ambulatory Visit: Payer: Medicare Other

## 2015-07-12 ENCOUNTER — Encounter: Payer: Self-pay | Admitting: Emergency Medicine

## 2015-07-12 ENCOUNTER — Ambulatory Visit (INDEPENDENT_AMBULATORY_CARE_PROVIDER_SITE_OTHER): Payer: Medicare Other | Admitting: Emergency Medicine

## 2015-07-12 VITALS — BP 136/72 | HR 48 | Ht 75.0 in | Wt 198.2 lb

## 2015-07-12 DIAGNOSIS — Z9189 Other specified personal risk factors, not elsewhere classified: Principal | ICD-10-CM

## 2015-07-12 DIAGNOSIS — Z79899 Other long term (current) drug therapy: Secondary | ICD-10-CM | POA: Diagnosis not present

## 2015-07-12 DIAGNOSIS — R942 Abnormal results of pulmonary function studies: Secondary | ICD-10-CM

## 2015-07-12 NOTE — Assessment & Plan Note (Signed)
Mild obstruction on PFTs. No wheezes on exam today.  Stopping amiodarone today due to changes noted on high res CT chest. Will consider trial of bronchodilators in future.

## 2015-07-12 NOTE — Progress Notes (Signed)
Subjective:    Patient ID: Vincent [name], male    DOB: 02-19-1941, 75 y.o.   MRN: [id number]  HPI 75 year old man with a history of CAD, atrial fibrillation, has been on amiodarone for about a year.  Also noted to have a history of nocturnal desaturation that prompted polysomnogram performed on, showed mild sleep apnea and upper airway resistant syndrome. He underwent a chest x-ray and pulmonary function testing is standard screening for potential amiodarone induced lung toxicity. PFT performed on 04/02/15 were reviewed today. This showed mild obstruction with no significant bronchodilator response. Decreased residual volume consistent with possible superimposed restriction, and a decreased diffusion capacity. His chest x-ray from 03/25/15 shows slight increase in his pulmonary interstitial markings particularly at the bilateral lung bases. He denies any problems with his breathing. He is still able to sing in the choir. He does not perceive cough or wheeze. He does have some nasal allergies. Of note he mentions Radon exposure in his home  ROV 07/12/15 -- Underwent high res CT chest with scattered subpleural reticulation bilaterally and fine centrilobular nodularity throughout both lungs. He is still taking amiodarone. He has not had any problems with his atrial fibrillation since he has been on the amiodarone.   Review of Systems  HENT: Positive for sneezing.   Gastrointestinal:       GERD    Past Medical History  Diagnosis Date  . Allergy     seasonal  . Cancer ([place])     basal cell/squamouscell  . GERD (gastroesophageal reflux disease)   . Hyperlipidemia   . Hypertension   . Prostate cancer ([place]) Nov.-Dec.2000    radiation  . Dysrhythmia     A FIB  . OSA (obstructive sleep apnea)      Family History  Problem Relation Age of Onset  . Heart disease Brother   . Diabetes Brother   . Diabetes Mother      Social History   Social History  . Marital Status: Married    Spouse Name:  [name] (goes by [name]  . Number of Children: 2  . Years of Education: Masters   Occupational History  . retired    Social History Main Topics  . Smoking status: Former Smoker -- 0.50 packs/day for 8 years    Types: Cigarettes    Quit date: 06/08/1965  . Smokeless tobacco: Never Used  . Alcohol Use: 6.0 oz/week    10 Glasses of wine per week  . Drug Use: No  . Sexual Activity: Not on file   Other Topics Concern  . Not on file   Social History Narrative   Caffeine- none.       No Known Allergies   Outpatient Prescriptions Prior to Visit  Medication Sig Dispense Refill  . amiodarone (PACERONE) 200 MG tablet Take 100 mg by mouth daily.     Marland Kitchen aspirin 81 MG tablet Take 81 mg by mouth daily.    . bethanechol (URECHOLINE) 25 MG tablet Take 25 mg by mouth 2 (two) times daily.     . bisoprolol-hydrochlorothiazide (ZIAC) 5-6.25 MG per tablet Take 0.5 tablets by mouth daily.     . calcium carbonate (OS-CAL) 600 MG TABS Take 600 mg by mouth 2 (two) times daily with a meal.     . Cholecalciferol (VITAMIN D) 2000 UNITS tablet Take 2,000 Units by mouth daily.     Marland Kitchen diltiazem (CARDIZEM) 120 MG tablet Take 120 mg by mouth 2 (two) times daily.     Marland Kitchen  doxazosin (CARDURA) 4 MG tablet Take 4 mg by mouth at bedtime.      . fluticasone (FLONASE) 50 MCG/ACT nasal spray Place 1 spray into both nostrils daily.    . Misc Natural Products (PUMPKIN SEED OIL) CAPS Take 1 capsule by mouth daily.      . Multiple Vitamins-Minerals (MULTIVITAMIN WITH MINERALS) tablet Take 1 tablet by mouth daily.      Marland Kitchen NITROSTAT 0.4 MG SL tablet Place 0.4 mg under the tongue every 5 (five) minutes as needed for chest pain.     Marland Kitchen omeprazole (PRILOSEC) 20 MG capsule Take 1 tablet by mouth daily.     . pravastatin (PRAVACHOL) 80 MG tablet Take 80 mg by mouth daily.    . psyllium (REGULOID) 0.52 G capsule Take 0.52 g by mouth daily.      . rivaroxaban (XARELTO) 20 MG TABS tablet Take 20 mg by mouth daily.    . vitamin C  (ASCORBIC ACID) 500 MG tablet Take 500 mg by mouth daily.      . vitamin E 400 UNIT capsule Take 400 Units by mouth daily.       No facility-administered medications prior to visit.         Objective:   Physical Exam Filed Vitals:   07/12/15 1544  BP: 136/72  Pulse: 48  Height: 6\' 3"  (1.905 m)  Weight: 198 lb 3.2 oz (89.903 kg)  SpO2: 98%   Gen: Pleasant, well-nourished, in no distress,  normal affect  ENT: No lesions,  mouth clear,  oropharynx clear, no postnasal drip  Neck: No JVD, no TMG, no carotid bruits  Lungs: No use of accessory muscles, clear without rales or rhonchi  Cardiovascular: RRR, heart sounds normal, no murmur or gallops, no peripheral edema  Musculoskeletal: No deformities, no cyanosis or clubbing  Neuro: alert, non focal  Skin: Warm, no lesions or rashes    03/25/15 --  COMPARISON: 12/08/2012. 05/22/2011. 05/16/2009.  FINDINGS: Mediastinum and hilar structures are normal. Slight increase in pulmonary interstitial markings are noted, particularly in the lung bases. Although these changes may be related to technique, interstitial pneumonitis possibly related to amiodarone therapy cannot be excluded. No pleural effusion. No pneumothorax. Heart size and pulmonary vascularity normal. Mild degenerative changes thoracic spine.  IMPRESSION: Mild increase in pulmonary interstitial markings are noted, particularly in the lung bases. Although these changes may be related to technique, interstitial pneumonitis, possibly related to amiodarone therapy cannot be excluded  05/14/15 -- CT Chest high resolution IMPRESSION: 1. Scattered subpleural reticulation throughout both lungs, with no significant regions of traction bronchiectasis or frank honeycombing. No consolidative airspace disease or regions of significantly increased lung attenuation. Although nonspecific, these findings could indicate developing amiodarone lung toxicity in a nonspecific  interstitial pneumonia (NSIP) pattern. A follow-up high-resolution chest CT study in 6-12 months could be useful to assess for temporal change. 2. Fine centrilobular nodularity throughout both lungs. In the outpatient setting in a non-smoker, the differential includes a mild infectious bronchiolitis, mild aspiration or hypersensitivity pneumonitis, although there is no significant air trapping on the expiration sequence.    Assessment & Plan:  At risk for amiodarone toxicity with long term use Scattered subpleural reticulation and fine centrolobular nodularity through both lungs on high res CT chest 05/14/15 -- represents diffuse inflammation and thickening of bronchioles. Low suspicion for opportunistic infection. Low likelihood for environmental exposures. Probable secondary to amiodarone.  Will stop amiodarone. Discussed with Dr. Einar Gip who agrees with plan. He will follow up  in April and may call Dr. Einar Gip if any problems arise sooner. Follow up with Dr. Lamonte Sakai in 2-3 months. Will likely obtain CXR, consider CT chest  Abnormal finding on pulmonary function testing Mild obstruction on PFTs. No wheezes on exam today.  Stopping amiodarone today due to changes noted on high res CT chest. Will consider trial of bronchodilators in future.  Jacques Earthly, MD  Internal Medicine PGY-2   Attending Note:  I have examined patient, reviewed labs, studies and notes. I have discussed the case with Dr Randell Patient, and I agree with the data and plans as amended above. We have been evaluating dyspnea as well as an associated restrictive pattern and effusion defect on pulmonary function testing. On my exam his lung s are clear. He denies any change in his breathing compared with last visit. He underwent CT chest that is consistent with subpleural reticulation and micronodular findings that could be consistent with early NSIP. Certainly early amiodarone toxicity could be a culprit here. There is nothing in the  history to support hypersensitivity pneumonitis or RB-ILD. I will recommend changing his amiodarone to an alternative. We will assess him clinically and also by chest x-ray +/- CT chest for interval improvement. If his interstitial changes persist then he may need further workup. With regard to his obstructive disease on PFT we discussed the utility of BD's - will revisit in the future.   Baltazar Apo, MD, PhD 07/12/2015, 5:18 PM [facility] [phone] or if no answer [phone]

## 2015-07-12 NOTE — Assessment & Plan Note (Signed)
Scattered subpleural reticulation and fine centrolobular nodularity through both lungs on high res CT chest 05/14/15 -- represents diffuse inflammation and thickening of bronchioles. Low suspicion for opportunistic infection. Low likelihood for environmental exposures. Probable secondary to amiodarone.  Will stop amiodarone. Discussed with Dr. Einar Gip who agrees with plan. He will follow up in April and may call Dr. Einar Gip if any problems arise sooner. Follow up with Dr. Lamonte Sakai in 2-3 months. Will likely obtain CXR, consider CT chest

## 2015-07-12 NOTE — Patient Instructions (Addendum)
Your CT scan of the chest shows evidence of interstitial inflammation  We will need to stop your amiodarone to see if your breathing improves and your CT scan and CXR clear We will discuss with Dr Einar Gip to plan this.  We will hold off on starting an inhaled medication at this time - we may reconsider in the future.  We will follow up in 2 -3 months after the amiodarone is stopped to repeat your CXR and / or CT chest.

## 2015-09-09 ENCOUNTER — Encounter: Payer: Self-pay | Admitting: Emergency Medicine

## 2015-09-09 ENCOUNTER — Ambulatory Visit (INDEPENDENT_AMBULATORY_CARE_PROVIDER_SITE_OTHER): Payer: Medicare Other | Admitting: Emergency Medicine

## 2015-09-09 VITALS — BP 120/60 | HR 62 | Ht 75.0 in | Wt 199.8 lb

## 2015-09-09 DIAGNOSIS — R942 Abnormal results of pulmonary function studies: Secondary | ICD-10-CM | POA: Diagnosis not present

## 2015-09-09 DIAGNOSIS — J449 Chronic obstructive pulmonary disease, unspecified: Secondary | ICD-10-CM

## 2015-09-09 DIAGNOSIS — Z79899 Other long term (current) drug therapy: Secondary | ICD-10-CM | POA: Diagnosis not present

## 2015-09-09 DIAGNOSIS — Z9189 Other specified personal risk factors, not elsewhere classified: Secondary | ICD-10-CM

## 2015-09-09 MED ORDER — ALBUTEROL SULFATE HFA 108 (90 BASE) MCG/ACT IN AERS: 2.0000 | INHALATION_SPRAY | Freq: Four times a day (QID) | RESPIRATORY_TRACT | Status: AC | PRN

## 2015-09-09 NOTE — Assessment & Plan Note (Signed)
Primary function testing consistent with mild chronic asthma. He is typically asymptomatic but he does note some triggers including smoke, perfume, cold air. I would like to teach him how to do albuterol so that he can have this available when necessary.

## 2015-09-09 NOTE — Assessment & Plan Note (Signed)
Reticular pattern on CT scan of the chest from December 2016. He has stopped amiodarone. I'll recheck a CT scan in December 2017. Depending on the results we will decide whether there is any merit in further ILD workup.

## 2015-09-09 NOTE — Progress Notes (Signed)
Subjective:    Patient ID: Vincent [name], male    DOB: [birth year], 75 y.o.   MRN: [id number]  HPI 75 year old man with a history of CAD, atrial fibrillation, has been on amiodarone for about a year.  Also noted to have a history of nocturnal desaturation that prompted polysomnogram performed on, showed mild sleep apnea and upper airway resistant syndrome. He underwent a chest x-ray and pulmonary function testing is standard screening for potential amiodarone induced lung toxicity. PFT performed on 04/02/15 were reviewed today. This showed mild obstruction with no significant bronchodilator response. Decreased residual volume consistent with possible superimposed restriction, and a decreased diffusion capacity. His chest x-ray from 03/25/15 shows slight increase in his pulmonary interstitial markings particularly at the bilateral lung bases. He denies any problems with his breathing. He is still able to sing in the choir. He does not perceive cough or wheeze. He does have some nasal allergies. Of note he mentions Radon exposure in his home  ROV 07/12/15 -- Underwent high res CT chest with scattered subpleural reticulation bilaterally and fine centrilobular nodularity throughout both lungs. He is still taking amiodarone. He has not had any problems with his atrial fibrillation since he has been on the amiodarone.   ROV 09/09/15 -- follow-up visit for mild interstitial lung disease on CT scan of the chest, mild obstruction on pulmonary function testing. As detailed above he has been on amiodarone for control of atrial fibrillation. In February we stopped amiodarone based on the possible relationship between this and his reticulonodular pattern on CT. He remains asymptomatic - no dyspnea. He has been traveling. Has not had any bouts of A fib, palpitations. He has ben able to exert and to sing. He has not had wheeze, cough. He recently started gabapentin for cervical stenosis.    Review of Systems  HENT: Positive  for sneezing.   Gastrointestinal:       GERD    Past Medical History  Diagnosis Date  . Allergy     seasonal  . Cancer ([place])     basal cell/squamouscell  . GERD (gastroesophageal reflux disease)   . Hyperlipidemia   . Hypertension   . Prostate cancer ([place]) Nov.-Dec.2000    radiation  . Dysrhythmia     A FIB  . OSA (obstructive sleep apnea)      Family History  Problem Relation Age of Onset  . Heart disease Brother   . Diabetes Brother   . Diabetes Mother      Social History   Social History  . Marital Status: Married    Spouse Name: [name] (goes by [name]  . Number of Children: 2  . Years of Education: Masters   Occupational History  . retired    Social History Main Topics  . Smoking status: Former Smoker -- 0.50 packs/day for 8 years    Types: Cigarettes    Quit date: 06/08/1965  . Smokeless tobacco: Never Used  . Alcohol Use: 6.0 oz/week    10 Glasses of wine per week  . Drug Use: No  . Sexual Activity: Not on file   Other Topics Concern  . Not on file   Social History Narrative   Caffeine- none.       No Known Allergies   Outpatient Prescriptions Prior to Visit  Medication Sig Dispense Refill  . aspirin 81 MG tablet Take 81 mg by mouth daily.    . bethanechol (URECHOLINE) 25 MG tablet Take 25 mg by mouth 2 (two)  times daily.     . bisoprolol-hydrochlorothiazide (ZIAC) 5-6.25 MG per tablet Take 0.5 tablets by mouth daily.     . calcium carbonate (OS-CAL) 600 MG TABS Take 600 mg by mouth 2 (two) times daily with a meal.     . Cholecalciferol (VITAMIN D) 2000 UNITS tablet Take 2,000 Units by mouth daily.     Marland Kitchen diltiazem (CARDIZEM) 120 MG tablet Take 120 mg by mouth 2 (two) times daily.     Marland Kitchen doxazosin (CARDURA) 4 MG tablet Take 4 mg by mouth at bedtime.      . fluticasone (FLONASE) 50 MCG/ACT nasal spray Place 1 spray into both nostrils daily.    . Misc Natural Products (PUMPKIN SEED OIL) CAPS Take 1 capsule by mouth daily.      . Multiple  Vitamins-Minerals (MULTIVITAMIN WITH MINERALS) tablet Take 1 tablet by mouth daily.      Marland Kitchen NITROSTAT 0.4 MG SL tablet Place 0.4 mg under the tongue every 5 (five) minutes as needed for chest pain.     Marland Kitchen omeprazole (PRILOSEC) 20 MG capsule Take 1 tablet by mouth daily.     . pravastatin (PRAVACHOL) 80 MG tablet Take 80 mg by mouth daily.    . psyllium (REGULOID) 0.52 G capsule Take 0.52 g by mouth daily.      . rivaroxaban (XARELTO) 20 MG TABS tablet Take 20 mg by mouth daily.    . vitamin C (ASCORBIC ACID) 500 MG tablet Take 500 mg by mouth daily.      . vitamin E 400 UNIT capsule Take 400 Units by mouth daily.      Marland Kitchen amiodarone (PACERONE) 200 MG tablet Take 100 mg by mouth daily. Reported on 09/09/2015     No facility-administered medications prior to visit.         Objective:   Physical Exam Filed Vitals:   09/09/15 1515  BP: 120/60  Pulse: 62  Height: 6\' 3"  (1.905 m)  Weight: 199 lb 12.8 oz (90.629 kg)  SpO2: 96%   Gen: Pleasant, well-nourished, in no distress,  normal affect  ENT: No lesions,  mouth clear,  oropharynx clear, no postnasal drip  Neck: No JVD, no TMG, no carotid bruits  Lungs: No use of accessory muscles, clear without rales or rhonchi  Cardiovascular: RRR, heart sounds normal, no murmur or gallops, no peripheral edema  Musculoskeletal: No deformities, no cyanosis or clubbing  Neuro: alert, non focal  Skin: Warm, no lesions or rashes    03/25/15 --  COMPARISON: 12/08/2012. 05/22/2011. 05/16/2009.  FINDINGS: Mediastinum and hilar structures are normal. Slight increase in pulmonary interstitial markings are noted, particularly in the lung bases. Although these changes may be related to technique, interstitial pneumonitis possibly related to amiodarone therapy cannot be excluded. No pleural effusion. No pneumothorax. Heart size and pulmonary vascularity normal. Mild degenerative changes thoracic spine.  IMPRESSION: Mild increase in pulmonary  interstitial markings are noted, particularly in the lung bases. Although these changes may be related to technique, interstitial pneumonitis, possibly related to amiodarone therapy cannot be excluded  05/14/15 -- CT Chest high resolution IMPRESSION: 1. Scattered subpleural reticulation throughout both lungs, with no significant regions of traction bronchiectasis or frank honeycombing. No consolidative airspace disease or regions of significantly increased lung attenuation. Although nonspecific, these findings could indicate developing amiodarone lung toxicity in a nonspecific interstitial pneumonia (NSIP) pattern. A follow-up high-resolution chest CT study in 6-12 months could be useful to assess for temporal change. 2. Fine centrilobular nodularity throughout both  lungs. In the outpatient setting in a non-smoker, the differential includes a mild infectious bronchiolitis, mild aspiration or hypersensitivity pneumonitis, although there is no significant air trapping on the expiration sequence.    Assessment & Plan:  Obstructive lung disease (generalized) ([place]) Primary function testing consistent with mild chronic asthma. He is typically asymptomatic but he does note some triggers including smoke, perfume, cold air. I would like to teach him how to do albuterol so that he can have this available when necessary.  At risk for amiodarone toxicity with long term use Reticular pattern on CT scan of the chest from December 2016. He has stopped amiodarone. I'll recheck a CT scan in December 2017. Depending on the results we will decide whether there is any merit in further ILD workup.    Baltazar Apo, MD, PhD 09/09/2015, 4:06 PM [facility] [phone] or if no answer [phone]

## 2015-09-09 NOTE — Patient Instructions (Addendum)
We will stay off amiodarone. If you have recurrent A fib then we will speak with Dr Einar Gip about a possible alternative.  We will teach you how to use albuterol. You may use this if you experience sudden shortness of breath.  We will repeat your CT scan of the chest in December 2017.  Follow with Dr Lamonte Sakai in  December after the CT to review

## 2015-12-11 ENCOUNTER — Other Ambulatory Visit: Payer: Self-pay | Admitting: Dermatology

## 2016-02-19 ENCOUNTER — Encounter: Payer: Self-pay | Admitting: Gastroenterology

## 2016-03-14 IMAGING — CR DG CHEST 2V
2 series · 2 of 2 positions shown · non-contrast
Comparison: 12/08/2012.  05/22/2011.  05/16/2009.

CLINICAL DATA: Amiodarone therapy.

EXAM:
CHEST  2 VIEW

[w chest pa]
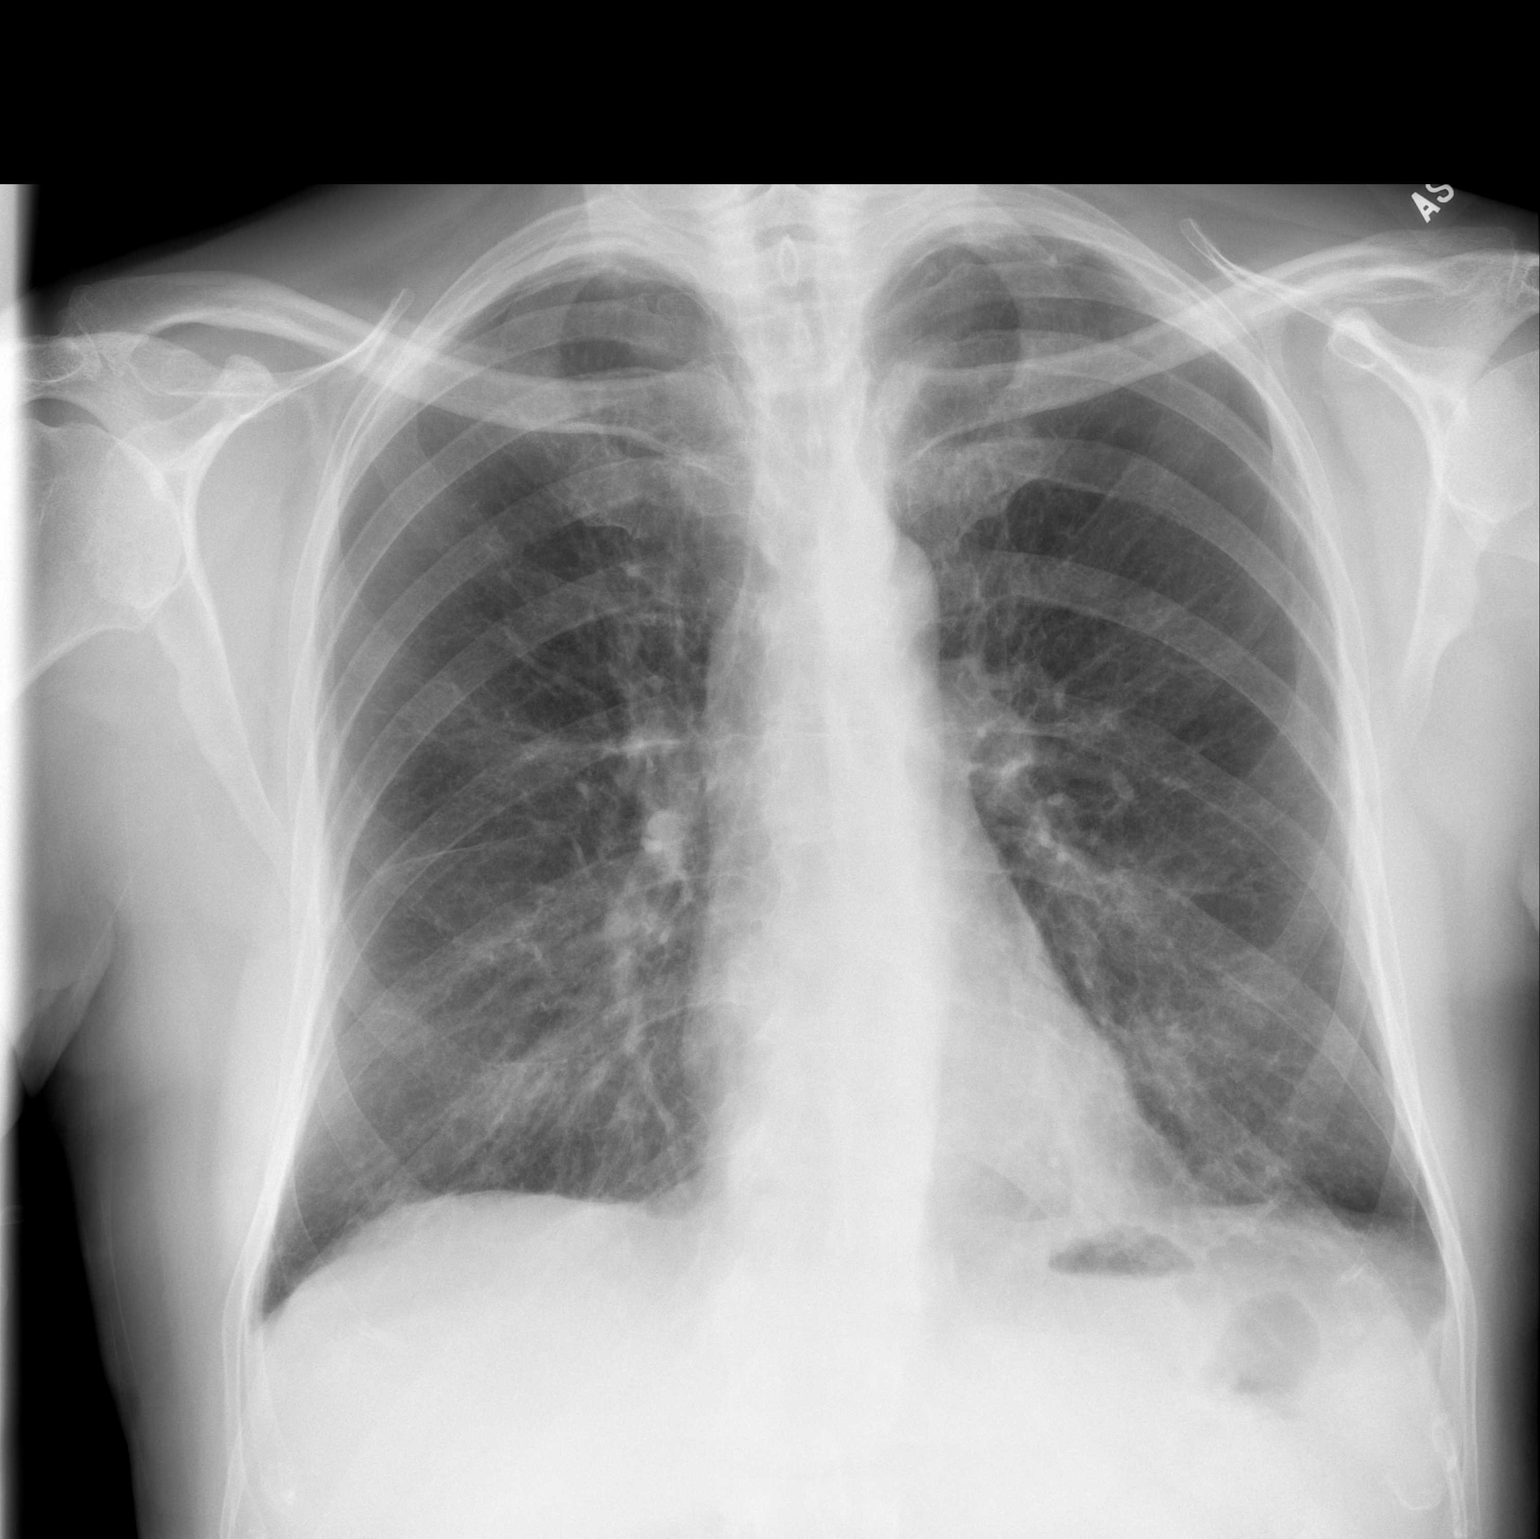

[w chest lat]
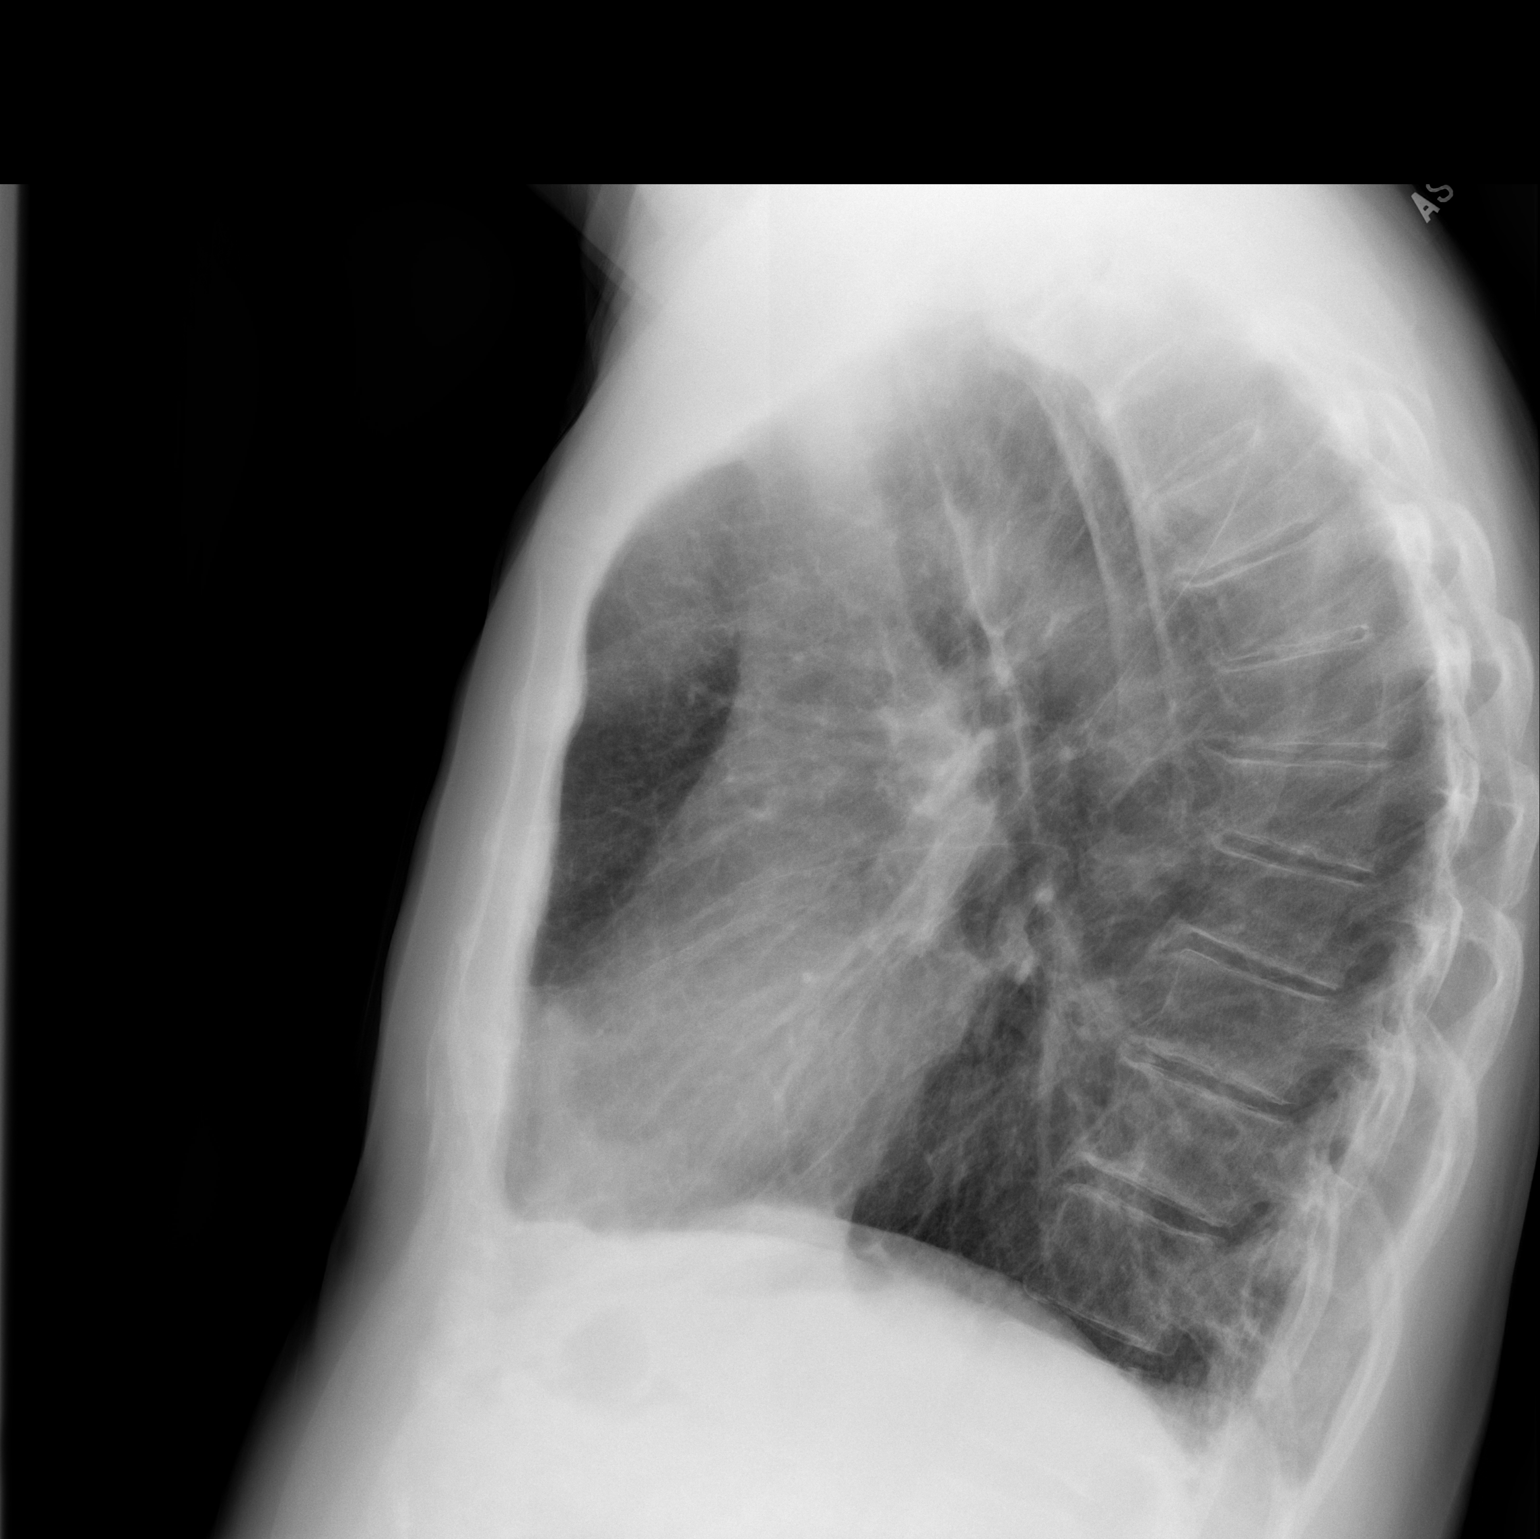

[2 of 2 positions shown; findings below may reference images not displayed]

FINDINGS: Mediastinum and hilar structures are normal. Slight increase in
pulmonary interstitial markings are noted, particularly in the lung
bases. Although these changes may be related to technique,
interstitial pneumonitis possibly related to amiodarone therapy
cannot be excluded. No pleural effusion. No pneumothorax. Heart size
and pulmonary vascularity normal. Mild degenerative changes thoracic
spine.
IMPRESSION: Mild increase in pulmonary interstitial markings are noted,
particularly in the lung bases. Although these changes may be
related to technique, interstitial pneumonitis, possibly related to
amiodarone therapy cannot be excluded.

## 2016-03-16 ENCOUNTER — Encounter: Payer: Self-pay | Admitting: Gastroenterology

## 2016-05-15 ENCOUNTER — Ambulatory Visit: Payer: Medicare Other | Admitting: Gastroenterology

## 2016-05-21 ENCOUNTER — Ambulatory Visit: Payer: Medicare Other | Admitting: Emergency Medicine

## 2016-05-21 ENCOUNTER — Inpatient Hospital Stay: Admission: RE | Admit: 2016-05-21 | Payer: Medicare Other | Source: Ambulatory Visit

## 2016-05-22 ENCOUNTER — Ambulatory Visit (INDEPENDENT_AMBULATORY_CARE_PROVIDER_SITE_OTHER)
Admission: RE | Admit: 2016-05-22 | Discharge: 2016-05-22 | Disposition: A | Discharge status: Home / Self Care | Payer: Medicare Other | Source: Ambulatory Visit | Attending: Emergency Medicine | Admitting: Emergency Medicine

## 2016-05-22 DIAGNOSIS — R942 Abnormal results of pulmonary function studies: Secondary | ICD-10-CM | POA: Diagnosis not present

## 2016-05-22 DIAGNOSIS — J449 Chronic obstructive pulmonary disease, unspecified: Secondary | ICD-10-CM | POA: Diagnosis not present

## 2016-05-25 ENCOUNTER — Encounter: Payer: Self-pay | Admitting: Emergency Medicine

## 2016-05-25 ENCOUNTER — Ambulatory Visit (INDEPENDENT_AMBULATORY_CARE_PROVIDER_SITE_OTHER): Payer: Medicare Other | Admitting: Emergency Medicine

## 2016-05-25 DIAGNOSIS — J849 Interstitial pulmonary disease, unspecified: Secondary | ICD-10-CM | POA: Insufficient documentation

## 2016-05-25 DIAGNOSIS — J449 Chronic obstructive pulmonary disease, unspecified: Secondary | ICD-10-CM | POA: Diagnosis not present

## 2016-05-25 NOTE — Patient Instructions (Signed)
Your CT scan of the chest is stable compared with the films we obtained last year. This is good news. We will follow-up in one year with a repeat chest x-ray at that time. Please arrange to see [place] sooner if you have any changes in your breathing or other problems.

## 2016-05-25 NOTE — Assessment & Plan Note (Signed)
Has albuterol to use as needed. He has never used it to date.

## 2016-05-25 NOTE — Assessment & Plan Note (Signed)
Has been ascribed to his amiodarone use - no longer taking. Mild residual parenchymal findings on CT scan of the chest. Certainly there has been no progression and possibly there is been some regression. I do not believe we need to continue to follow serial CT scans especially since he is no longer on amiodarone. I will follow him with an annual chest x-ray and consider a repeat CT if he changes clinically.

## 2016-05-25 NOTE — Progress Notes (Signed)
Subjective:    Patient ID: Vincent [name], male    DOB: Sep 12, 1940, 75 y.o.   MRN: [id number]  HPI 75 year old man with a history of CAD, atrial fibrillation, has been on amiodarone for about a year.  Also noted to have a history of nocturnal desaturation that prompted polysomnogram performed on, showed mild sleep apnea and upper airway resistant syndrome. He underwent a chest x-ray and pulmonary function testing is standard screening for potential amiodarone induced lung toxicity. PFT performed on 04/02/15 were reviewed today. This showed mild obstruction with no significant bronchodilator response. Decreased residual volume consistent with possible superimposed restriction, and a decreased diffusion capacity. His chest x-ray from 03/25/15 shows slight increase in his pulmonary interstitial markings particularly at the bilateral lung bases. He denies any problems with his breathing. He is still able to sing in the choir. He does not perceive cough or wheeze. He does have some nasal allergies. Of note he mentions Radon exposure in his home  ROV 07/12/15 -- Underwent high res CT chest with scattered subpleural reticulation bilaterally and fine centrilobular nodularity throughout both lungs. He is still taking amiodarone. He has not had any problems with his atrial fibrillation since he has been on the amiodarone.   ROV 09/09/15 -- follow-up visit for mild interstitial lung disease on CT scan of the chest, mild obstruction on pulmonary function testing. As detailed above he has been on amiodarone for control of atrial fibrillation. In February we stopped amiodarone based on the possible relationship between this and his reticulonodular pattern on CT. He remains asymptomatic - no dyspnea. He has been traveling. Has not had any bouts of A fib, palpitations. He has ben able to exert and to sing. He has not had wheeze, cough. He recently started gabapentin for cervical stenosis.   ROV 05/25/16 -- This follow-up visit  for mild interstitial lung disease we have been following by CT scan of the chest, mild obstructive lung disease in the setting of minimal former tobacco. Based on interstitial changes we decided to stop his amiodarone and he remains off of this. He was started on Multac in [place] and has done quite well on it. A repeat CT scan of his chest was done on 05/22/16 and I have personally reviewed. This shows continued biapical pleural parenchymal scarring, some scattered micronodular disease. There is no change compared with his prior scan that was done approximally one year ago. He denies any breathing problems, has never needed albuterol.    Review of Systems  HENT: Positive for sneezing.   Gastrointestinal:       GERD       Objective:   Physical Exam Vitals:   05/25/16 1031 05/25/16 1034  BP:  100/60  Pulse:  64  SpO2:  95%  Weight: 190 lb 6.4 oz (86.4 kg)   Height: 6\' 3"  (1.905 m)    Gen: Pleasant, well-nourished, in no distress,  normal affect  ENT: No lesions,  mouth clear,  oropharynx clear, no postnasal drip  Neck: No JVD, no TMG, no carotid bruits  Lungs: No use of accessory muscles, clear without rales or rhonchi  Cardiovascular: RRR, heart sounds normal, no murmur or gallops, no peripheral edema  Musculoskeletal: No deformities, no cyanosis or clubbing  Neuro: alert, non focal  Skin: Warm, no lesions or rashes        Assessment & Plan:  Obstructive lung disease (generalized) ([facility]) Has albuterol to use as needed. He has never used it to date.  Interstitial lung disease ([place]) Has been ascribed to his amiodarone use - no longer taking. Mild residual parenchymal findings on CT scan of the chest. Certainly there has been no progression and possibly there is been some regression. I do not believe we need to continue to follow serial CT scans especially since he is no longer on amiodarone. I will follow him with an annual chest x-ray and consider a repeat CT if he changes  clinically.   Baltazar Apo, MD, PhD 05/25/2016, 10:55 AM [facility] [phone] or if no answer [phone]

## 2016-06-11 ENCOUNTER — Other Ambulatory Visit: Payer: Self-pay

## 2016-06-11 ENCOUNTER — Ambulatory Visit (INDEPENDENT_AMBULATORY_CARE_PROVIDER_SITE_OTHER): Payer: Medicare Other | Admitting: Gastroenterology

## 2016-06-11 ENCOUNTER — Encounter: Payer: Self-pay | Admitting: Gastroenterology

## 2016-06-11 VITALS — BP 126/50 | HR 76 | Ht 74.0 in | Wt 191.4 lb

## 2016-06-11 DIAGNOSIS — Z86010 Personal history of colonic polyps: Secondary | ICD-10-CM

## 2016-06-11 DIAGNOSIS — I4821 Permanent atrial fibrillation: Secondary | ICD-10-CM

## 2016-06-11 DIAGNOSIS — Z7901 Long term (current) use of anticoagulants: Secondary | ICD-10-CM

## 2016-06-11 DIAGNOSIS — I482 Chronic atrial fibrillation: Secondary | ICD-10-CM

## 2016-06-11 MED ORDER — NA SULFATE-K SULFATE-MG SULF 17.5-3.13-1.6 GM/177ML PO SOLN: 1.0000 | Freq: Once | ORAL | 0 refills | Status: AC

## 2016-06-11 NOTE — Progress Notes (Signed)
[facility] Consult Note:  History: Vincent [name] 06/11/2016  Referring physician: Simona Huh, MD  Reason for consult/chief complaint: hx of colon polyps (pt on Xarelto)   Subjective  HPI:  This is a 76 year old man with a history of colon polyps. He had a hyperplastic polyp in 2005, a 3 mm tubular adenoma without dysplasia in November 2012 Vincent [name]).  He denies abdominal pain, change in bowel habits or rectal bleeding. He is on Xarelto for A. fib. His last cardiologist note is not available as he is not in this health system and EMR. Vincent [name] denies a history of TIA or stroke.   ROS:  Review of Systems  Constitutional: Negative for appetite change and unexpected weight change.  HENT: Negative for mouth sores and voice change.   Eyes: Negative for pain and redness.  Respiratory: Negative for cough and shortness of breath.   Cardiovascular: Negative for chest pain and palpitations.  Genitourinary: Negative for dysuria and hematuria.  Musculoskeletal: Positive for neck pain. Negative for arthralgias and myalgias.  Skin: Negative for pallor and rash.  Neurological: Negative for weakness and headaches.  Hematological: Negative for adenopathy.     Past Medical History: Past Medical History:  Diagnosis Date  . Adenomatous colon polyp   . Allergy    seasonal  . CHF (congestive heart failure) ([place])   . Diverticulosis   . Dysrhythmia    A FIB  . GERD (gastroesophageal reflux disease)   . Hyperlipidemia   . Hypertension   . OSA (obstructive sleep apnea)   . Prostate cancer ([place]) Nov.-Dec.2000   radiation  . Skin cancer    basal cell/squamouscell     Past Surgical History: Past Surgical History:  Procedure Laterality Date  . INSERTION PROSTATE RADIATION SEED  06/1999   TUNA, 1999  . LEFT HEART CATHETERIZATION WITH CORONARY ANGIOGRAM N/A 03/16/2014   Procedure: LEFT HEART CATHETERIZATION WITH CORONARY ANGIOGRAM;  Surgeon: Laverda Page, MD;  Location:  [facility] CATH LAB;  Service: Cardiovascular;  Laterality: N/A;  . NASAL SEPTUM SURGERY  12/1997   also polyps removed  . PROSTATE SURGERY  01/1999  . TRANSURETHRAL NEEDLE ABLATION OF THE PROSTATE  2000   TUNA     Family History: Family History  Problem Relation Age of Onset  . Diabetes Mother   . Heart disease Mother   . Heart disease Father   . Heart disease Brother   . Diabetes Brother   . Fibromyalgia Sister     Social History: Social History   Social History  . Marital status: Married    Spouse name: [name] (goes by Vincent [name]  . Number of children: 2  . Years of education: Masters   Occupational History  . retired principal    Social History Main Topics  . Smoking status: Former Smoker    Packs/day: 0.50    Years: 8.00    Types: Cigarettes    Quit date: 06/08/1965  . Smokeless tobacco: Never Used  . Alcohol use 6.0 oz/week    10 Glasses of wine per week  . Drug use: No  . Sexual activity: Not Asked   Other Topics Concern  . None   Social History Narrative   Caffeine- none.     He travels frequently with his wife and friends. Allergies: No Known Allergies  Outpatient Meds: Current Outpatient Prescriptions  Medication Sig Dispense Refill  . aspirin 81 MG tablet Take 81 mg by mouth daily.    . bethanechol (URECHOLINE) 25 MG tablet  Take 25 mg by mouth 2 (two) times daily.     . calcium carbonate (OS-CAL) 600 MG TABS Take 600 mg by mouth 2 (two) times daily with a meal.     . Cholecalciferol (VITAMIN D) 2000 UNITS tablet Take 2,000 Units by mouth daily.     Marland Kitchen diltiazem (CARDIZEM) 120 MG tablet Take 120 mg by mouth 2 (two) times daily.     Marland Kitchen doxazosin (CARDURA) 4 MG tablet Take 4 mg by mouth at bedtime.      . dronedarone (MULTAQ) 400 MG tablet Take 400 mg by mouth 2 (two) times daily with a meal.    . fluticasone (FLONASE) 50 MCG/ACT nasal spray Place 1 spray into both nostrils daily.    . Misc Natural Products (PUMPKIN SEED OIL) CAPS Take 1 capsule by mouth  daily.      . Multiple Vitamins-Minerals (MULTIVITAMIN WITH MINERALS) tablet Take 1 tablet by mouth daily.      Marland Kitchen omeprazole (PRILOSEC) 20 MG capsule Take 1 tablet by mouth daily.     . pravastatin (PRAVACHOL) 80 MG tablet Take 80 mg by mouth daily.    . psyllium (REGULOID) 0.52 G capsule Take 0.52 g by mouth daily.      . rivaroxaban (XARELTO) 20 MG TABS tablet Take 20 mg by mouth daily.    . vitamin C (ASCORBIC ACID) 500 MG tablet Take 500 mg by mouth daily.      . vitamin E 400 UNIT capsule Take 400 Units by mouth daily.      Marland Kitchen albuterol (PROVENTIL HFA;VENTOLIN HFA) 108 (90 Base) MCG/ACT inhaler Inhale 2 puffs into the lungs every 6 (six) hours as needed for wheezing or shortness of breath. (Patient not taking: Reported on 06/11/2016) 1 Inhaler 1  . Na Sulfate-K Sulfate-Mg Sulf 17.5-3.13-1.6 GM/180ML SOLN Take 1 kit by mouth once. 354 mL 0  . NITROSTAT 0.4 MG SL tablet Place 0.4 mg under the tongue every 5 (five) minutes as needed for chest pain.      No current facility-administered medications for this visit.       ___________________________________________________________________ Objective   Exam:  BP (!) 126/50 (BP Location: Left Arm, Patient Position: Sitting, Cuff Size: Normal)   Pulse 76   Ht 6' 2"  (1.88 m) Comment: height measured without shoes  Wt 191 lb 6 oz (86.8 kg)   BMI 24.57 kg/m    General: this is a(n) well-appearing elderly man   Eyes: sclera anicteric, no redness  ENT: oral mucosa moist without lesions, no cervical or supraclavicular lymphadenopathy, good dentition  CV: RRR without murmur, S1/S2, no JVD, no peripheral edema  Resp: clear to auscultation bilaterally, normal RR and effort noted  GI: soft, no tenderness, with active bowel sounds. No guarding or palpable organomegaly noted.  Skin; warm and dry, no rash or jaundice noted  Neuro: awake, alert and oriented x 3. Normal gross motor function and fluent speech    Assessment: Encounter  Diagnoses  Name Primary?  Marland Kitchen Hx of adenomatous colonic polyps Yes  . Permanent atrial fibrillation ([place])   . Chronic anticoagulation     He appears appropriate for surveillance colonoscopy. We will can mitigate with his cardiologist about stopping the Xarelto 2 days prior to the procedure. It seems I will likely be low risk since he is in a regular heart rhythm right now and has no history of TIA/CVA. He is in agreement  Thank you for the courtesy of this consult.  Please call me with  any questions or concerns.  Nelida Meuse III  CC: Vincent Huh, MD

## 2016-06-11 NOTE — Patient Instructions (Signed)
If you are age 78 or older, your body mass index should be between 23-30. Your Body mass index is 24.57 kg/m. If this is out of the aforementioned range listed, please consider follow up with your Primary Care Provider.  If you are age 66 or younger, your body mass index should be between 19-25. Your Body mass index is 24.57 kg/m. If this is out of the aformentioned range listed, please consider follow up with your Primary Care Provider.   You have been scheduled for a colonoscopy. Please follow written instructions given to you at your visit today.  Please pick up your prep supplies at the pharmacy within the next 1-3 days. If you use inhalers (even only as needed), please bring them with you on the day of your procedure. Your physician has requested that you go to www.startemmi.com and enter the access code given to you at your visit today. This web site gives a general overview about your procedure. However, you should still follow specific instructions given to you by our office regarding your preparation for the procedure.  Thank you for choosing [facility]  Dr Wilfrid Lund III

## 2016-06-15 ENCOUNTER — Telehealth: Payer: Self-pay

## 2016-06-15 NOTE — Telephone Encounter (Signed)
Blood thinner letter was faxed to Dr Einar Gip on 06-11-2016. Requesting to holdXarelto. Pt is scheduled for a colonoscopy on 08-18-2016. Per Dr Einar Gip returned fax, pt is to hold for 48 hours and restart the day of the colonoscopy if NO biopsy otherwise restart 4-5 days after biopsy. Pt has been notified and aware. He states clear understanding.

## 2016-08-04 ENCOUNTER — Encounter: Payer: Self-pay | Admitting: Gastroenterology

## 2016-08-17 ENCOUNTER — Telehealth: Payer: Self-pay | Admitting: Gastroenterology

## 2016-08-18 ENCOUNTER — Encounter: Payer: Self-pay | Admitting: Gastroenterology

## 2016-08-18 ENCOUNTER — Ambulatory Visit (AMBULATORY_SURGERY_CENTER): Payer: Medicare Other | Admitting: Gastroenterology

## 2016-08-18 VITALS — BP 147/68 | HR 61 | Temp 95.9°F | Resp 11 | Ht 74.0 in | Wt 191.0 lb

## 2016-08-18 DIAGNOSIS — D123 Benign neoplasm of transverse colon: Secondary | ICD-10-CM | POA: Diagnosis not present

## 2016-08-18 DIAGNOSIS — Z86010 Personal history of colonic polyps: Secondary | ICD-10-CM | POA: Diagnosis not present

## 2016-08-18 DIAGNOSIS — D122 Benign neoplasm of ascending colon: Secondary | ICD-10-CM | POA: Diagnosis not present

## 2016-08-18 MED ORDER — SODIUM CHLORIDE 0.9 % IV SOLN: 500.0000 mL | INTRAVENOUS | Status: AC

## 2016-08-18 NOTE — Patient Instructions (Signed)
YOU HAD AN ENDOSCOPIC PROCEDURE TODAY AT [facility]:   Refer to the procedure report that was given to you for any specific questions about what was found during the examination.  If the procedure report does not answer your questions, please call your gastroenterologist to clarify.  If you requested that your care partner not be given the details of your procedure findings, then the procedure report has been included in a sealed envelope for you to review at your convenience later.  YOU SHOULD EXPECT: Some feelings of bloating in the abdomen. Passage of more gas than usual.  Walking can help get rid of the air that was put into your GI tract during the procedure and reduce the bloating. If you had a lower endoscopy (such as a colonoscopy or flexible sigmoidoscopy) you may notice spotting of blood in your stool or on the toilet paper. If you underwent a bowel prep for your procedure, you may not have a normal bowel movement for a few days.  Please Note:  You might notice some irritation and congestion in your nose or some drainage.  This is from the oxygen used during your procedure.  There is no need for concern and it should clear up in a day or so.  SYMPTOMS TO REPORT IMMEDIATELY:   Following lower endoscopy (colonoscopy or flexible sigmoidoscopy):  Excessive amounts of blood in the stool  Significant tenderness or worsening of abdominal pains  Swelling of the abdomen that is new, acute  Fever of 100F or higher    For urgent or emergent issues, a gastroenterologist can be reached at any hour by calling [phone].  Please read all handouts given to you by your recovery nurse.  Medications: Resume xarelto at prior dose on Thursday. No ibuprofen, NSAIDS, Aleve, motrin for 5 days.   DIET:  We do recommend a small meal at first, but then you may proceed to your regular diet.  Drink plenty of fluids but you should avoid alcoholic beverages for 24 hours.  ACTIVITY:  You  should plan to take it easy for the rest of today and you should NOT DRIVE or use heavy machinery until tomorrow (because of the sedation medicines used during the test).    FOLLOW UP: Our staff will call the number listed on your records the next business day following your procedure to check on you and address any questions or concerns that you may have regarding the information given to you following your procedure. If we do not reach you, we will leave a message.  However, if you are feeling well and you are not experiencing any problems, there is no need to return our call.  We will assume that you have returned to your regular daily activities without incident.  If any biopsies were taken you will be contacted by phone or by letter within the next 1-3 weeks.  Please call us at [phone] if you have not heard about the biopsies in 3 weeks.    SIGNATURES/CONFIDENTIALITY: You and/or your care partner have signed paperwork which will be entered into your electronic medical record.  These signatures attest to the fact that that the information above on your After Visit Summary has been reviewed and is understood.  Full responsibility of the confidentiality of this discharge information lies with you and/or your care-partner.  Thank you for letting us take care of your healthcare needs today,

## 2016-08-18 NOTE — Progress Notes (Signed)
Patient ID: Vincent [name], male   DOB: [birth year], 76 y.o.   MRN: [id number] Patient [name] to rn

## 2016-08-18 NOTE — Progress Notes (Signed)
Called to room to assist during endoscopic procedure.  Patient ID and intended procedure confirmed with present staff. Received instructions for my participation in the procedure from the performing physician.  

## 2016-08-18 NOTE — Progress Notes (Signed)
Patient awakening,report to rn,vss

## 2016-08-18 NOTE — Op Note (Signed)
[facility] Patient Name: Vincent [name] Procedure Date: 08/18/2016 1:47 PM MRN: [id number] Endoscopist: [place]. Loletha Carrow , MD Age: 76 Referring MD:  Date of Birth: 1940/11/05 Gender: Male Account #: [id number] Procedure:                Colonoscopy Indications:              Surveillance: Personal history of adenomatous                            polyps on last colonoscopy > 5 years ago (78mm                            tubular adenoma Nov 2012) Medicines:                Monitored Anesthesia Care Procedure:                Pre-Anesthesia Assessment:                           - Prior to the procedure, a History and Physical                            was performed, and patient medications and                            allergies were reviewed. The patient's tolerance of                            previous anesthesia was also reviewed. The risks                            and benefits of the procedure and the sedation                            options and risks were discussed with the patient.                            All questions were answered, and informed consent                            was obtained. Prior Anticoagulants: The patient has                            taken Xarelto (rivaroxaban), last dose was 2 days                            prior to procedure. ASA Grade Assessment: III - A                            patient with severe systemic disease. After                            reviewing the risks and benefits, the patient was  deemed in satisfactory condition to undergo the                            procedure.                           After obtaining informed consent, the colonoscope                            was passed under direct vision. Throughout the                            procedure, the patient's blood pressure, pulse, and                            oxygen saturations were monitored continuously. The   Colonoscope was introduced through the anus and                            advanced to the the cecum, identified by                            appendiceal orifice and ileocecal valve. The                            colonoscopy was performed without difficulty. The                            patient tolerated the procedure well. The quality                            of the bowel preparation was good. The ileocecal                            valve, appendiceal orifice, and rectum were                            photographed. Scope In: 2:08:56 PM Scope Out: 2:31:55 PM Scope Withdrawal Time: 0 hours 19 minutes 17 seconds  Total Procedure Duration: 0 hours 22 minutes 59 seconds  Findings:                 The perianal and digital rectal examinations were                            normal.                           Two sessile polyps were found in the proximal                            transverse colon and proximal ascending colon. The                            polyps were 2 mm in size. These polyps were removed  with a cold biopsy forceps. Resection and retrieval                            were complete.                           Two sessile polyps were found in the proximal                            ascending colon. The polyps were 2 to 8 mm in size.                            These polyps were removed with a cold snare.                            Resection and retrieval were complete.                           The exam was otherwise without abnormality on                            direct and retroflexion views. Complications:            No immediate complications. Estimated Blood Loss:     Estimated blood loss was minimal. Impression:               - Two 2 mm polyps in the proximal transverse colon                            and in the proximal ascending colon, removed with a                            cold biopsy forceps. Resected and retrieved.                            - Two 2 to 8 mm polyps in the proximal ascending                            colon, removed with a cold snare. Resected and                            retrieved.                           - The examination was otherwise normal on direct                            and retroflexion views. Recommendation:           - Patient has a contact number available for                            emergencies. The signs and symptoms of potential  delayed complications were discussed with the                            patient. Return to normal activities tomorrow.                            Written discharge instructions were provided to the                            patient.                           - Resume previous diet.                           - Resume Xarelto (rivaroxaban) at prior dose in 2                            days.                           - No aspirin, ibuprofen, naproxen, or other                            non-steroidal anti-inflammatory drugs for 5 days                            after polyp removal.                           - Await pathology results.                           - No repeat colonoscopy due to age. Henry L. Loletha Carrow, MD 08/18/2016 2:38:06 PM This report has been signed electronically.

## 2016-08-19 ENCOUNTER — Telehealth: Payer: Self-pay

## 2016-08-19 NOTE — Telephone Encounter (Signed)
  Follow up Call-  Call back number 08/18/2016  Post procedure Call Back phone  # [phone] cell  Permission to leave phone message Yes  Some recent data might be hidden     Patient questions:  Do you have a fever, pain , or abdominal swelling? No. Pain Score  0 *  Have you tolerated food without any problems? Yes.    Have you been able to return to your normal activities? Yes.    Do you have any questions about your discharge instructions: Diet   No. Medications  No. Follow up visit  No.  Do you have questions or concerns about your Care? No.  Actions: * If pain score is 4 or above: No action needed, pain <4.  No problems noted per pt. maw

## 2016-08-27 ENCOUNTER — Encounter: Payer: Self-pay | Admitting: Gastroenterology

## 2016-09-15 NOTE — Telephone Encounter (Signed)
Done

## 2017-01-19 ENCOUNTER — Other Ambulatory Visit: Payer: Self-pay | Admitting: Dermatology

## 2017-01-27 ENCOUNTER — Encounter: Payer: Self-pay | Admitting: Gastroenterology

## 2017-02-18 ENCOUNTER — Encounter: Payer: Self-pay | Admitting: Emergency Medicine

## 2017-02-18 ENCOUNTER — Ambulatory Visit (INDEPENDENT_AMBULATORY_CARE_PROVIDER_SITE_OTHER)
Admission: RE | Admit: 2017-02-18 | Discharge: 2017-02-18 | Disposition: A | Discharge status: Home / Self Care | Payer: Medicare Other | Source: Ambulatory Visit | Attending: Emergency Medicine | Admitting: Emergency Medicine

## 2017-02-18 ENCOUNTER — Ambulatory Visit (INDEPENDENT_AMBULATORY_CARE_PROVIDER_SITE_OTHER): Payer: Medicare Other | Admitting: Emergency Medicine

## 2017-02-18 DIAGNOSIS — J849 Interstitial pulmonary disease, unspecified: Secondary | ICD-10-CM

## 2017-02-18 DIAGNOSIS — J449 Chronic obstructive pulmonary disease, unspecified: Secondary | ICD-10-CM | POA: Diagnosis not present

## 2017-02-18 NOTE — Addendum Note (Signed)
Addended by: Maryanna Shape A on: 02/18/2017 11:13 AM   Modules accepted: Orders

## 2017-02-18 NOTE — Assessment & Plan Note (Signed)
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 It has not progressed based on most recent CT scan of the chest. We will follow today with a repeat chest x-ray. He likely needs CT scans of the chest at some interval, possibly every 2-3 years. If he develops symptoms then would of course do more frequently.

## 2017-02-18 NOTE — Assessment & Plan Note (Signed)
Keep albuterol available to use in the event that you experience shortness of breath or wheezing.

## 2017-02-18 NOTE — Patient Instructions (Addendum)
We will perform a chest x-ray today We will forward the chest x-ray results, all of your pulmonary records to Dr Marisue Humble to be forwarded to [facility] in preparation for your move.  Keep albuterol available to use in the event that you experience shortness of breath or wheezing.

## 2017-02-18 NOTE — Progress Notes (Signed)
Subjective:    Patient ID: Vincent [name], male    DOB: 1940/07/06, 76 y.o.   MRN: [id number]  HPI 76 year old man with a history of CAD, atrial fibrillation, has been on amiodarone for about a year.  Also noted to have a history of nocturnal desaturation that prompted polysomnogram performed on, showed mild sleep apnea and upper airway resistant syndrome. He underwent a chest x-ray and pulmonary function testing is standard screening for potential amiodarone induced lung toxicity. PFT performed on 04/02/15 were reviewed today. This showed mild obstruction with no significant bronchodilator response. Decreased residual volume consistent with possible superimposed restriction, and a decreased diffusion capacity. His chest x-ray from 03/25/15 shows slight increase in his pulmonary interstitial markings particularly at the bilateral lung bases. He denies any problems with his breathing. He is still able to sing in the choir. He does not perceive cough or wheeze. He does have some nasal allergies. Of note he mentions Radon exposure in his home  ROV 78/3/17 -- Underwent high res CT chest with scattered subpleural reticulation bilaterally and fine centrilobular nodularity throughout both lungs. He is still taking amiodarone. He has not had any problems with his atrial fibrillation since he has been on the amiodarone.   ROV 78/3/17 -- follow-up visit for mild interstitial lung disease on CT scan of the chest, mild obstruction on pulmonary function testing. As detailed above he has been on amiodarone for control of atrial fibrillation. In February we stopped amiodarone based on the possible relationship between this and his reticulonodular pattern on CT. He remains asymptomatic - no dyspnea. He has been traveling. Has not had any bouts of A fib, palpitations. He has ben able to exert and to sing. He has not had wheeze, cough. He recently started gabapentin for cervical stenosis.   ROV 78/18/17 -- This follow-up visit  for mild interstitial lung disease we have been following by CT scan of the chest, mild obstructive lung disease in the setting of minimal former tobacco. Based on interstitial changes we decided to stop his amiodarone and he remains off of this. He was started on Multac in [place] and has done quite well on it. A repeat CT scan of his chest was done on 05/22/16 and I have personally reviewed. This shows continued biapical pleural parenchymal scarring, some scattered micronodular disease. There is no change compared with his prior scan that was done approximally one year ago. He denies any breathing problems, has never needed albuterol.   ROV 78/13/18 -- patient has a history of mild obstructive lung disease, mild interstitial disease that we have been following with serial imaging. He was formerly on amiodarone, we have stopped this. Last CT scan 12/17 showed stable mild ILD.  He has never needed or used his SABA.                                                                                                                                                                                                                                                                                  Marland Kitchen  Review of Systems  HENT: Positive for sneezing.   Gastrointestinal:       GERD       Objective:   Physical Exam Vitals:   02/18/17 1038  BP: 124/70  Pulse: 96  SpO2: 96%  Weight: 191 lb 3.2 oz (86.7 kg)  Height: 6\' 3"  (1.905 m)   Gen: Pleasant, well-nourished, in no distress,  normal affect  ENT: No lesions,  mouth clear,  oropharynx clear, no postnasal drip  Neck: No JVD, no TMG, no carotid bruits  Lungs: No use of accessory muscles, basilar mild insp crackles, R > L   Cardiovascular: RRR, heart sounds normal, no murmur or gallops, no peripheral edema  Musculoskeletal: No deformities, no cyanosis or clubbing  Neuro: alert, non focal  Skin: Warm, no lesions or rashes        Assessment & Plan:    Obstructive lung disease (generalized) ([facility]) Keep albuterol available to use in the event that you experience shortness of breath or wheezing.  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 It has not progressed based on most recent CT scan of the chest. We will follow today with a repeat chest x-ray. He likely needs CT scans of the chest at some interval, possibly every 2-3 years. If he develops symptoms then would of course do more frequently.    Baltazar Apo, MD, PhD 02/18/2017, 11:09 AM [facility] [phone] or if no answer [phone]

## 2017-02-25 ENCOUNTER — Encounter: Payer: Self-pay | Admitting: *Deleted

## 2017-03-02 ENCOUNTER — Telehealth: Payer: Self-pay | Admitting: Emergency Medicine

## 2017-03-02 NOTE — Telephone Encounter (Signed)
Pt is aware of results and voiced his understanding. Pt states he reviewed CXR via mychart and is concerned about degenerative changes thoracic spine. He is also wanting to know if he should continue to follow a pulmonologist, as he will be moving soon.  RB please advise. Thanks.

## 2017-03-02 NOTE — Telephone Encounter (Signed)
[phone] Patient is returning phone call.

## 2017-03-02 NOTE — Telephone Encounter (Signed)
Notes recorded by Collene Gobble, MD on 02/22/2017 at 9:32 AM EDT Please let the patient know that his CXR shows stable scarring. No concerning changes. Thanks.       Called pt but got disconnected. Called back x 2 and still couldn't get through. I will try to call back or await pt to call back.

## 2017-03-03 NOTE — Telephone Encounter (Signed)
ATC pt, no answer. Left message for pt to call back.  

## 2017-03-03 NOTE — Telephone Encounter (Signed)
Advised pt of results. Pt understood and nothing further is needed.   

## 2017-03-03 NOTE — Telephone Encounter (Signed)
Let him know that since his CXR's have been stable and he is no longer on amiodarone, he can probably stop seeing a pulmonologist unles his breathing changes.   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 Nothing necessarily needs to be done unless he is having symptoms - back pain, neuropathic pain, etc.

## 2018-02-08 IMAGING — DX DG CHEST 2V
2 series · 2 of 2 positions shown · non-contrast
Comparison: CT 05/22/2006.  Chest x-ray 03/25/2015 .

CLINICAL DATA: Routine evaluation. History of hypertension. History
CHF .

EXAM:
CHEST  2 VIEW

[chest pa]
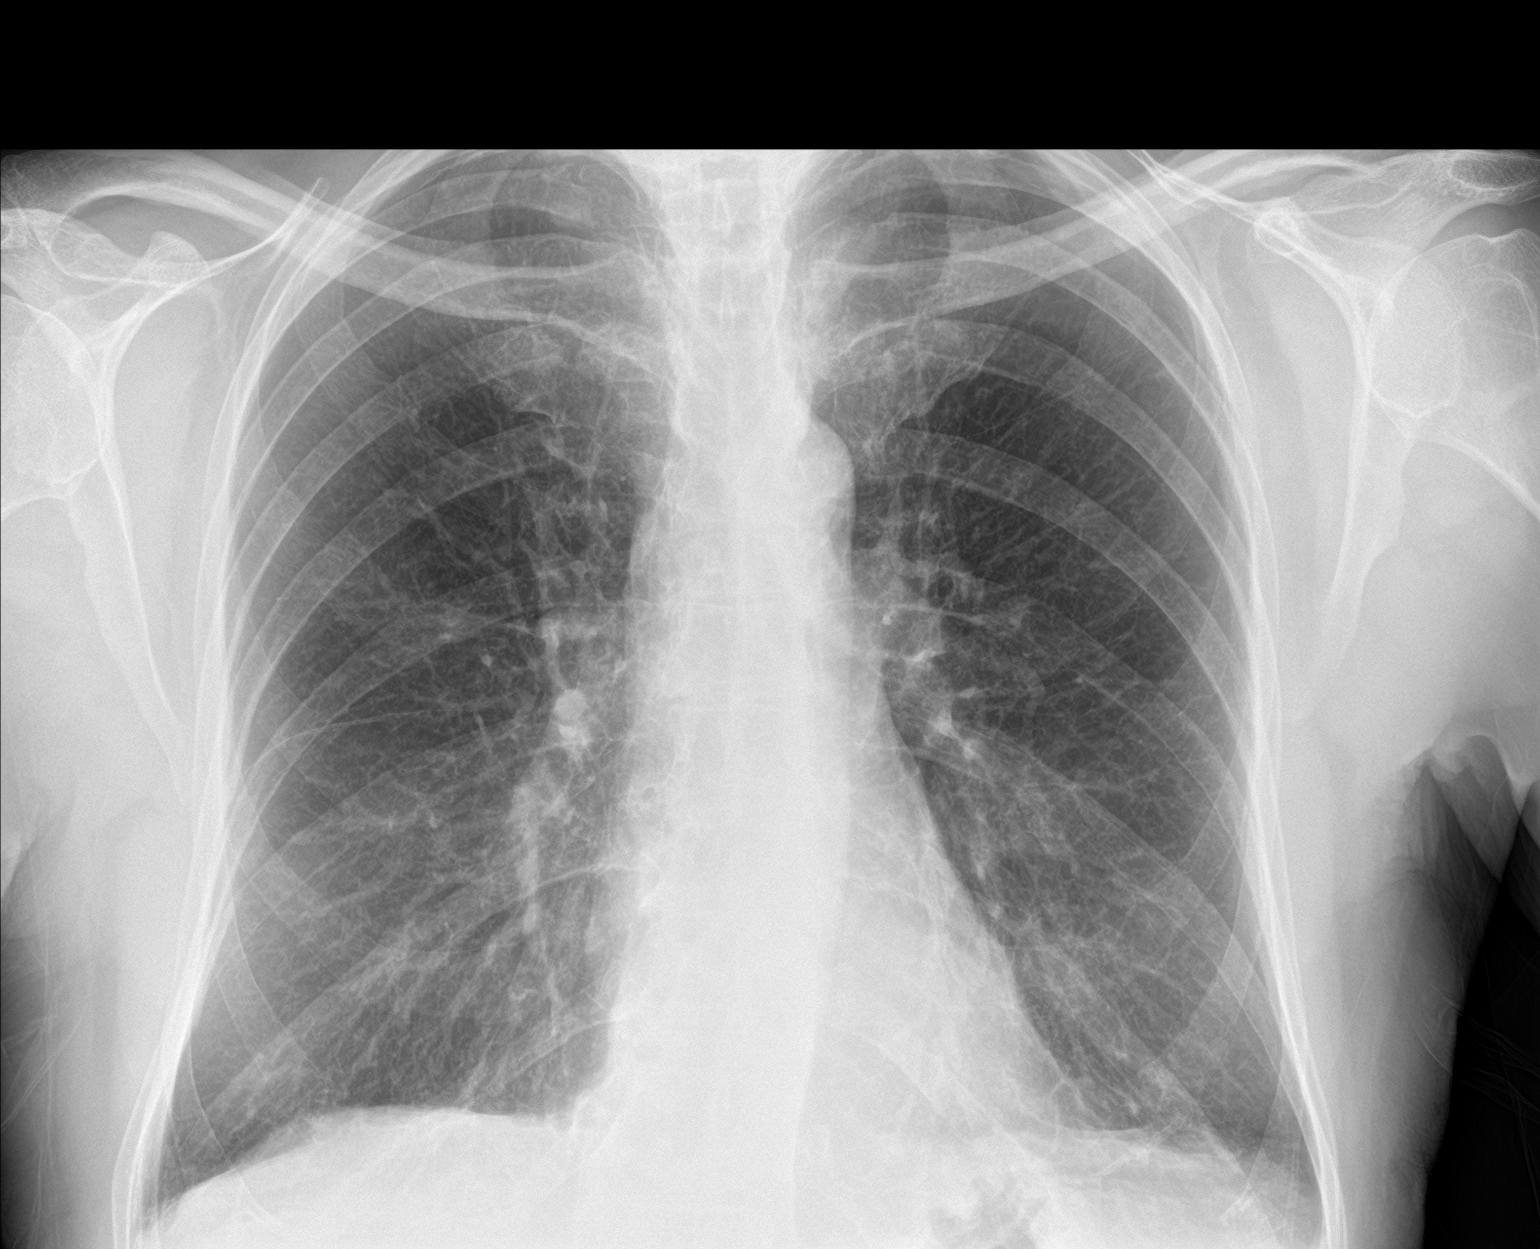

[chest lat]
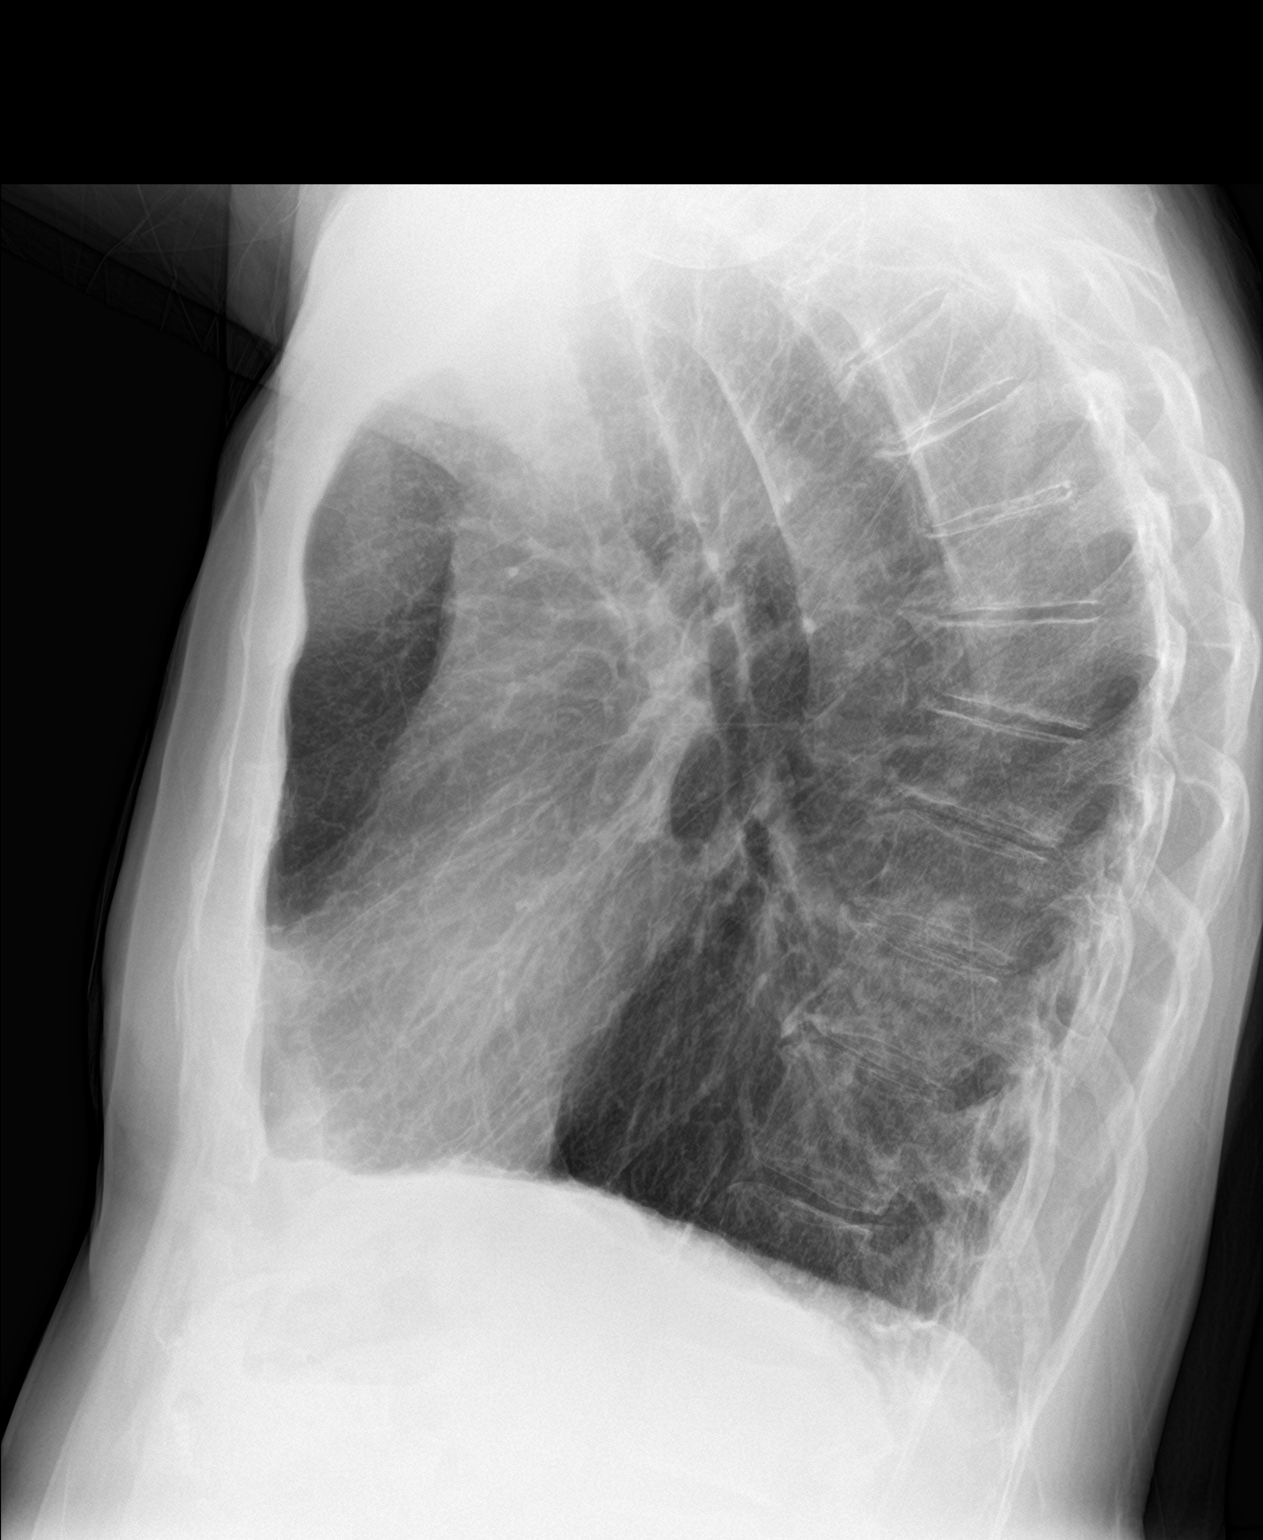

[2 of 2 positions shown; findings below may reference images not displayed]

FINDINGS: Mediastinum and hilar structures are normal. Mild bibasal
pleuroparenchymal thickening consistent with scarring . No pleural
effusion or pneumothorax. Degenerative changes thoracic spine.
IMPRESSION: Mild bibasilar pleuroparenchymal thickening consistent with
scarring.
